# Patient Record
Sex: Female | Born: 1995 | Race: Black or African American | Hispanic: No | Marital: Single | Smoking: Never smoker
Health system: Southern US, Community
[De-identification: ages and names within clinical notes are randomized; demographics above are authoritative.]

## PROBLEM LIST (undated history)

## (undated) DIAGNOSIS — M419 Scoliosis, unspecified: Secondary | ICD-10-CM

## (undated) DIAGNOSIS — Z5189 Encounter for other specified aftercare: Secondary | ICD-10-CM

## (undated) DIAGNOSIS — D649 Anemia, unspecified: Secondary | ICD-10-CM

## (undated) HISTORY — PX: NO PAST SURGERIES: SHX2092

## (undated) HISTORY — PX: INCISION AND DRAINAGE: SHX5863

---
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
## 2017-02-24 LAB — OB RESULTS CONSOLE HGB/HCT, BLOOD: HEMOGLOBIN: 12.1

## 2017-10-09 LAB — OB RESULTS CONSOLE ANTIBODY SCREEN: Antibody Screen: NEGATIVE

## 2017-10-09 LAB — OB RESULTS CONSOLE PLATELET COUNT: Platelets: 229

## 2017-10-09 LAB — OB RESULTS CONSOLE RUBELLA ANTIBODY, IGM: RUBELLA: IMMUNE

## 2017-10-09 LAB — OB RESULTS CONSOLE HIV ANTIBODY (ROUTINE TESTING): HIV: NONREACTIVE

## 2017-10-09 LAB — OB RESULTS CONSOLE RPR: RPR: NONREACTIVE

## 2017-10-09 LAB — OB RESULTS CONSOLE GC/CHLAMYDIA
Chlamydia: NEGATIVE
GC PROBE AMP, GENITAL: NEGATIVE

## 2017-10-09 LAB — OB RESULTS CONSOLE ABO/RH: RH Type: POSITIVE

## 2017-10-09 LAB — CYTOLOGY - PAP: Pap: NEGATIVE

## 2017-10-09 LAB — OB RESULTS CONSOLE HGB/HCT, BLOOD
HEMATOCRIT: 39
HEMOGLOBIN: 13

## 2017-10-09 LAB — OB RESULTS CONSOLE HEPATITIS B SURFACE ANTIGEN: HEP B S AG: NEGATIVE

## 2017-12-10 NOTE — L&D Delivery Note (Signed)
Delivery Note  First Stage: Labor onset: 0809 with induction Analgesia /Anesthesia intrapartum:epidural AROM at 0809 - moderate port wine bloody fluid with two small cltas (3cm size passed in fluid)  Second Stage: Complete dilation at [phone] - onset of pushing at 0948 delivery of nonviable fetus at [phone] by CNM in direct OA position no nuchal cord Cord double clamped and cut by FOB Cord blood sample collected   Elevation in BP with active labor and through epidural placement reported rectal pressure and pain after epidural placement with complete dilation +3 station. BP range 139-155 / 90-97   Third Stage: Placenta delivered with maternal effort with 3 VC @ 0957 Approximately 3/4 placental bed covered with clot material c/w large abruption Placenta disposition: pathology Uterine tone firm / bleeding moderate - large amount of clots expressed then manually removed (placental bucket over 1/2 full with clots and blood - clots 2cm to 10+cm size)  Uterine tone improved after removal of clots - pitocin !VF bolus started / cytpotec 8000 per rectum given on 2nd recheck of bleeding  Est. Blood Loss (mL): 2 liters over course labor and birth  Complications: IUFD from acute large abruption  BP stabilized out of severe range after delivery without medications. BP range immediate PP: 118-131/70-89  Labs (scheduled for 11am) called to be drawn.  Mom to remain on BS for additional time for observation and management.  Baby to family to grieve.  Newborn: Birth Weight: 3 pounds - 3 ounces  Apgar Scores: 1-minute: 0                           5-minute: 0     Susa Loffleranya Bailey CNM, MSN, [facility] 03/20/2018, 10:43 AM

## 2018-02-24 LAB — GLUCOSE, 1 HOUR: GLUCOSE 1 HOUR: 109

## 2018-02-24 LAB — OB RESULTS CONSOLE HGB/HCT, BLOOD: HCT: 37

## 2018-02-24 LAB — OB RESULTS CONSOLE PLATELET COUNT: Platelets: 222

## 2018-03-04 ENCOUNTER — Encounter (HOSPITAL_COMMUNITY): Payer: Self-pay | Admitting: *Deleted

## 2018-03-04 ENCOUNTER — Inpatient Hospital Stay (HOSPITAL_COMMUNITY): Payer: Medicaid Other

## 2018-03-04 ENCOUNTER — Inpatient Hospital Stay (HOSPITAL_COMMUNITY)
Admission: EM | Admit: 2018-03-04 | Discharge: 2018-03-06 | DRG: 833 | Disposition: A | Discharge status: Home / Self Care | Payer: Medicaid Other | Attending: Certified Nurse Midwife | Admitting: Certified Nurse Midwife

## 2018-03-04 ENCOUNTER — Emergency Department (HOSPITAL_COMMUNITY): Payer: Medicaid Other

## 2018-03-04 ENCOUNTER — Other Ambulatory Visit: Payer: Self-pay

## 2018-03-04 DIAGNOSIS — O9A219 Injury, poisoning and certain other consequences of external causes complicating pregnancy, unspecified trimester: Secondary | ICD-10-CM

## 2018-03-04 DIAGNOSIS — O26893 Other specified pregnancy related conditions, third trimester: Principal | ICD-10-CM | POA: Diagnosis present

## 2018-03-04 DIAGNOSIS — R1030 Lower abdominal pain, unspecified: Secondary | ICD-10-CM | POA: Diagnosis present

## 2018-03-04 DIAGNOSIS — Z3A30 30 weeks gestation of pregnancy: Secondary | ICD-10-CM | POA: Diagnosis not present

## 2018-03-04 DIAGNOSIS — R51 Headache: Secondary | ICD-10-CM | POA: Diagnosis present

## 2018-03-04 DIAGNOSIS — O368390 Maternal care for abnormalities of the fetal heart rate or rhythm, unspecified trimester, not applicable or unspecified: Secondary | ICD-10-CM

## 2018-03-04 HISTORY — DX: Scoliosis, unspecified: M41.9

## 2018-03-04 LAB — TYPE AND SCREEN
ABO/RH(D): O POS
Antibody Screen: NEGATIVE

## 2018-03-04 LAB — CBC
HCT: 38.3 % (ref 36.0–46.0)
HEMOGLOBIN: 12.9 g/dL (ref 12.0–15.0)
MCH: 29 pg (ref 26.0–34.0)
MCHC: 33.7 g/dL (ref 30.0–36.0)
MCV: 86.1 fL (ref 78.0–100.0)
Platelets: 194 10*3/uL (ref 150–400)
RBC: 4.45 MIL/uL (ref 3.87–5.11)
RDW: 12.8 % (ref 11.5–15.5)
WBC: 6.6 10*3/uL (ref 4.0–10.5)

## 2018-03-04 LAB — URINALYSIS, ROUTINE W REFLEX MICROSCOPIC
BILIRUBIN URINE: NEGATIVE
Glucose, UA: NEGATIVE mg/dL
HGB URINE DIPSTICK: NEGATIVE
Ketones, ur: NEGATIVE mg/dL
NITRITE: NEGATIVE
PROTEIN: NEGATIVE mg/dL
Specific Gravity, Urine: 1.006 (ref 1.005–1.030)
pH: 6 (ref 5.0–8.0)

## 2018-03-04 LAB — BASIC METABOLIC PANEL
Anion gap: 10 (ref 5–15)
BUN: 7 mg/dL (ref 6–20)
CHLORIDE: 106 mmol/L (ref 101–111)
CO2: 21 mmol/L — ABNORMAL LOW (ref 22–32)
CREATININE: 0.57 mg/dL (ref 0.44–1.00)
Calcium: 8.9 mg/dL (ref 8.9–10.3)
GFR calc Af Amer: >60 mL/min (ref >60)
GFR calc non Af Amer: >60 mL/min (ref >60)
Glucose, Bld: 87 mg/dL (ref 65–99)
Potassium: 3.4 mmol/L — ABNORMAL LOW (ref 3.5–5.1)
SODIUM: 137 mmol/L (ref 135–145)

## 2018-03-04 LAB — SAVE SMEAR

## 2018-03-04 LAB — PROTIME-INR
INR: 0.93
Prothrombin Time: 12.4 seconds (ref 11.4–15.2)

## 2018-03-04 LAB — PLATELET COUNT: Platelets: 188 10*3/uL (ref 150–400)

## 2018-03-04 LAB — KLEIHAUER-BETKE STAIN
# Vials RhIg: 1
Fetal Cells %: 0 %
Quantitation Fetal Hemoglobin: 0 mL

## 2018-03-04 LAB — ABO/RH: ABO/RH(D): O POS

## 2018-03-04 LAB — APTT: aPTT: 27 seconds (ref 24–36)

## 2018-03-04 LAB — FIBRINOGEN: Fibrinogen: 409 mg/dL (ref 210–475)

## 2018-03-04 MED ORDER — BETAMETHASONE SOD PHOS & ACET 6 (3-3) MG/ML IJ SUSP
12.0000 mg | INTRAMUSCULAR | Status: AC
  Administered 2018-03-04 – 2018-03-05 (×2): 12 mg via INTRAMUSCULAR
  Filled 2018-03-04 (×2): qty 2

## 2018-03-04 MED ORDER — CALCIUM CARBONATE ANTACID 500 MG PO CHEW: 2.0000 | CHEWABLE_TABLET | ORAL | Status: DC | PRN

## 2018-03-04 MED ORDER — PRENATAL MULTIVITAMIN CH
1.0000 | ORAL_TABLET | Freq: Every day | ORAL | Status: DC
  Administered 2018-03-05: 1 via ORAL
  Filled 2018-03-04 (×2): qty 1

## 2018-03-04 MED ORDER — ACETAMINOPHEN 325 MG PO TABS
650.0000 mg | ORAL_TABLET | ORAL | Status: DC | PRN
  Administered 2018-03-04: 650 mg via ORAL
  Filled 2018-03-04: qty 2

## 2018-03-04 MED ORDER — SODIUM CHLORIDE 0.9 % IV BOLUS
1000.0000 mL | Freq: Once | INTRAVENOUS | Status: AC
  Administered 2018-03-04: 1000 mL via INTRAVENOUS

## 2018-03-04 MED ORDER — SODIUM CHLORIDE 0.9 % IV SOLN
INTRAVENOUS | Status: DC
  Administered 2018-03-04: 10:00:00 via INTRAVENOUS

## 2018-03-04 MED ORDER — DOCUSATE SODIUM 100 MG PO CAPS
100.0000 mg | ORAL_CAPSULE | Freq: Every day | ORAL | Status: DC
  Administered 2018-03-05 – 2018-03-06 (×2): 100 mg via ORAL
  Filled 2018-03-04 (×5): qty 1
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
## 2018-03-04 NOTE — ED Notes (Signed)
Pt placed on 2L of O2 via [place] per Dr. Emelda FearFerguson at bedside. Pt also placed on left side per Denny PeonErin, OB Rapid Response RN.

## 2018-03-04 NOTE — ED Notes (Signed)
[facility] at bedside. OB Rapid Response notified that transport is at bedside per their request. Per Denny PeonErin, [place] RN pt is not stable for transport at this time. Keep pt on monitor and continue to monitor at [facility] per Odessa FlemingErin, OB RN. EDP notified. [facility] notified.
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
## 2018-03-04 NOTE — ED Notes (Signed)
[facility] at bedside with Randie HeinzJessica Cox, OB RN to [facility].
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
## 2018-03-04 NOTE — Progress Notes (Addendum)
1009 T. Fredric MareBailey, CNM notified of FHR decel present upon pt's return from bathroom.  Orders to delay [facility] transport of pt due to FHR concerns that make pt unstable for transport.  [facility] to evaluate the pt in person. 1015 [facility] ED RN and EDP notified of CNM orders. EDP has contact number for Dr. Billy Coastaavon and states that he will call him with concerns over delay in transport. 1025 [facility] RN calling for update and to offer to pick up OBRR RN and portable EFM if needed. 1038 Spoke with Wiliam Ke. Bailey en route now to [facility] ED. 1050 Dr. Emelda FearFerguson of FP in house at [facility] and evaluating pt per request EDP. 1204 T. Fredric MareBailey, CNM requests OB RN for continuous EFM en route from [facility].  She reports improved FHR tracing and favorable BPP report. 1218 [facility] will send unit to pick up J. Cox, OB RN and portable EFM and transport to [facility] ED.

## 2018-03-04 NOTE — ED Notes (Signed)
Pt returning from bathroom per ED RN. FHR decel noted upon reinstating monitoring.

## 2018-03-05 ENCOUNTER — Other Ambulatory Visit (HOSPITAL_COMMUNITY): Payer: Self-pay | Admitting: Obstetrics and Gynecology

## 2018-03-05 ENCOUNTER — Inpatient Hospital Stay (HOSPITAL_COMMUNITY): Payer: Medicaid Other

## 2018-03-05 ENCOUNTER — Encounter (HOSPITAL_COMMUNITY): Payer: Medicaid Other

## 2018-03-05 LAB — FIBRINOGEN: Fibrinogen: 404 mg/dL (ref 210–475)

## 2018-03-05 LAB — KLEIHAUER-BETKE STAIN
# VIALS RHIG: 1
FETAL CELLS %: 0 %
QUANTITATION FETAL HEMOGLOBIN: 0 mL

## 2018-03-05 LAB — PLATELET COUNT: Platelets: 190 10*3/uL (ref 150–400)

## 2018-03-05 LAB — D-DIMER, QUANTITATIVE (NOT AT ARMC): D-Dimer, Quant: 0.61 ug/mL-FEU — ABNORMAL HIGH (ref 0.00–0.50)

## 2018-03-05 MED ORDER — LACTATED RINGERS IV SOLN
INTRAVENOUS | Status: DC
  Administered 2018-03-05 – 2018-03-06 (×3): via INTRAVENOUS

## 2018-03-05 MED ORDER — LACTATED RINGERS IV BOLUS
500.0000 mL | Freq: Once | INTRAVENOUS | Status: AC
Start: 2018-03-05 — End: 2018-03-05
  Administered 2018-03-05: 500 mL via INTRAVENOUS

## 2018-03-05 NOTE — Progress Notes (Signed)
CSW spoke with bedside nurse regarding the need for CSW consult.  At this time bedside and patinet could not identify a need for CSW consult.    CSW completed a chart review and did to review any information that warrants a consult.   Please re-consult or contact if a need arises or at patient's request.  Blaine HamperAngel Boyd-Gilyard, MSW, LCSW Clinical Social Work [phone]
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
## 2018-03-06 ENCOUNTER — Inpatient Hospital Stay (HOSPITAL_COMMUNITY): Payer: Medicaid Other

## 2018-03-06 NOTE — Plan of Care (Signed)
  Problem: Clinical Measurements: Goal: Diagnostic test results will improve Outcome: Progressing   Problem: Safety: Goal: Ability to remain free from injury will improve Outcome: Progressing   Problem: Coping: Goal: Ability to identify and develop effective coping behavior will improve Outcome: Progressing

## 2018-03-06 NOTE — Progress Notes (Signed)
Pt ambulated out teaching complete  

## 2018-03-06 NOTE — Progress Notes (Signed)
Patient ID: Carrie Willis, female   DOB: 09/05/1996, 22 y.o.   MRN: [id number] No complaints Good FM No pain BP 114/67 (BP Location: Right Arm)   Pulse (!) 53   Temp 98.6 F (37 C) (Oral)   Resp 18   Ht 5' 2.5" (1.588 m)   Wt 68.9 kg (152 lb)   SpO2 100% Comment: room air  BMI 27.36 kg/m   Fhr category 1 No contractions DC home

## 2018-03-07 LAB — URINE CULTURE: Culture: 80000 — AB

## 2018-03-12 ENCOUNTER — Other Ambulatory Visit (HOSPITAL_COMMUNITY): Payer: Self-pay | Admitting: Certified Nurse Midwife

## 2018-03-12 DIAGNOSIS — O288 Other abnormal findings on antenatal screening of mother: Secondary | ICD-10-CM

## 2018-03-12 DIAGNOSIS — Z3A31 31 weeks gestation of pregnancy: Secondary | ICD-10-CM

## 2018-03-13 ENCOUNTER — Ambulatory Visit (HOSPITAL_COMMUNITY)
Admission: RE | Admit: 2018-03-13 | Discharge: 2018-03-13 | Disposition: A | Discharge status: Home / Self Care | Payer: Medicaid Other | Source: Ambulatory Visit | Attending: Certified Nurse Midwife | Admitting: Certified Nurse Midwife

## 2018-03-13 DIAGNOSIS — Z3A31 31 weeks gestation of pregnancy: Secondary | ICD-10-CM | POA: Diagnosis not present

## 2018-03-13 DIAGNOSIS — O289 Unspecified abnormal findings on antenatal screening of mother: Secondary | ICD-10-CM | POA: Diagnosis not present

## 2018-03-13 DIAGNOSIS — O288 Other abnormal findings on antenatal screening of mother: Secondary | ICD-10-CM

## 2018-03-18 ENCOUNTER — Encounter: Payer: Self-pay | Admitting: *Deleted

## 2018-03-20 ENCOUNTER — Inpatient Hospital Stay (HOSPITAL_COMMUNITY)
Admission: AD | Admit: 2018-03-20 | Discharge: 2018-03-23 | DRG: 805 | Disposition: A | Discharge status: Home / Self Care | Payer: Medicaid Other | Source: Ambulatory Visit | Attending: Obstetrics and Gynecology | Admitting: Obstetrics and Gynecology

## 2018-03-20 ENCOUNTER — Inpatient Hospital Stay (HOSPITAL_COMMUNITY): Payer: Medicaid Other | Admitting: Anesthesiology

## 2018-03-20 ENCOUNTER — Encounter: Payer: Medicaid Other | Admitting: Obstetrics and Gynecology

## 2018-03-20 ENCOUNTER — Encounter (HOSPITAL_COMMUNITY): Payer: Self-pay | Admitting: *Deleted

## 2018-03-20 ENCOUNTER — Inpatient Hospital Stay (HOSPITAL_COMMUNITY): Payer: Medicaid Other

## 2018-03-20 ENCOUNTER — Other Ambulatory Visit: Payer: Self-pay

## 2018-03-20 DIAGNOSIS — M419 Scoliosis, unspecified: Secondary | ICD-10-CM | POA: Diagnosis present

## 2018-03-20 DIAGNOSIS — Z3A32 32 weeks gestation of pregnancy: Secondary | ICD-10-CM | POA: Diagnosis not present

## 2018-03-20 DIAGNOSIS — D62 Acute posthemorrhagic anemia: Secondary | ICD-10-CM | POA: Diagnosis not present

## 2018-03-20 DIAGNOSIS — O4593 Premature separation of placenta, unspecified, third trimester: Secondary | ICD-10-CM | POA: Diagnosis present

## 2018-03-20 DIAGNOSIS — O4693 Antepartum hemorrhage, unspecified, third trimester: Secondary | ICD-10-CM

## 2018-03-20 DIAGNOSIS — O164 Unspecified maternal hypertension, complicating childbirth: Secondary | ICD-10-CM | POA: Diagnosis present

## 2018-03-20 DIAGNOSIS — O364XX0 Maternal care for intrauterine death, not applicable or unspecified: Secondary | ICD-10-CM | POA: Diagnosis present

## 2018-03-20 DIAGNOSIS — F419 Anxiety disorder, unspecified: Secondary | ICD-10-CM | POA: Diagnosis present

## 2018-03-20 DIAGNOSIS — O9081 Anemia of the puerperium: Secondary | ICD-10-CM | POA: Diagnosis not present

## 2018-03-20 DIAGNOSIS — O99344 Other mental disorders complicating childbirth: Secondary | ICD-10-CM | POA: Diagnosis present

## 2018-03-20 LAB — COMPREHENSIVE METABOLIC PANEL
ALT: 22 U/L (ref 14–54)
ALT: 21 U/L (ref 14–54)
ALT: 17 U/L (ref 14–54)
AST: 29 U/L (ref 15–41)
AST: 26 U/L (ref 15–41)
AST: 26 U/L (ref 15–41)
Albumin: 2.7 g/dL — ABNORMAL LOW (ref 3.5–5.0)
Albumin: 2.4 g/dL — ABNORMAL LOW (ref 3.5–5.0)
Albumin: 1.9 g/dL — ABNORMAL LOW (ref 3.5–5.0)
Alkaline Phosphatase: 139 U/L — ABNORMAL HIGH (ref 38–126)
Alkaline Phosphatase: 121 U/L (ref 38–126)
Alkaline Phosphatase: 102 U/L (ref 38–126)
Anion gap: 9 (ref 5–15)
Anion gap: 11 (ref 5–15)
Anion gap: 5 (ref 5–15)
BUN: 17 mg/dL (ref 6–20)
BUN: 15 mg/dL (ref 6–20)
BUN: 10 mg/dL (ref 6–20)
CO2: 21 mmol/L — ABNORMAL LOW (ref 22–32)
CO2: 20 mmol/L — ABNORMAL LOW (ref 22–32)
CO2: 24 mmol/L (ref 22–32)
Calcium: 8.5 mg/dL — ABNORMAL LOW (ref 8.9–10.3)
Calcium: 8.2 mg/dL — ABNORMAL LOW (ref 8.9–10.3)
Calcium: 8.1 mg/dL — ABNORMAL LOW (ref 8.9–10.3)
Chloride: 103 mmol/L (ref 101–111)
Chloride: 103 mmol/L (ref 101–111)
Chloride: 106 mmol/L (ref 101–111)
Creatinine, Ser: 0.84 mg/dL (ref 0.44–1.00)
Creatinine, Ser: 0.83 mg/dL (ref 0.44–1.00)
Creatinine, Ser: 0.73 mg/dL (ref 0.44–1.00)
GFR calc Af Amer: >60 mL/min (ref >60)
GFR calc Af Amer: >60 mL/min (ref >60)
GFR calc Af Amer: >60 mL/min (ref >60)
GFR calc non Af Amer: >60 mL/min (ref >60)
GFR calc non Af Amer: >60 mL/min (ref >60)
GFR calc non Af Amer: >60 mL/min (ref >60)
Glucose, Bld: 92 mg/dL (ref 65–99)
Glucose, Bld: 132 mg/dL — ABNORMAL HIGH (ref 65–99)
Glucose, Bld: 115 mg/dL — ABNORMAL HIGH (ref 65–99)
Potassium: 3.8 mmol/L (ref 3.5–5.1)
Potassium: 4.4 mmol/L (ref 3.5–5.1)
Potassium: 4 mmol/L (ref 3.5–5.1)
Sodium: 133 mmol/L — ABNORMAL LOW (ref 135–145)
Sodium: 134 mmol/L — ABNORMAL LOW (ref 135–145)
Sodium: 135 mmol/L (ref 135–145)
Total Bilirubin: 0.7 mg/dL (ref 0.3–1.2)
Total Bilirubin: 0.8 mg/dL (ref 0.3–1.2)
Total Bilirubin: 0.5 mg/dL (ref 0.3–1.2)
Total Protein: 5.7 g/dL — ABNORMAL LOW (ref 6.5–8.1)
Total Protein: 5.1 g/dL — ABNORMAL LOW (ref 6.5–8.1)
Total Protein: 4.3 g/dL — ABNORMAL LOW (ref 6.5–8.1)

## 2018-03-20 LAB — DIC (DISSEMINATED INTRAVASCULAR COAGULATION) PANEL (NOT AT ARMC)
Fibrinogen: 211 mg/dL (ref 210–475)
INR: 1.08
Prothrombin Time: 13.9 seconds (ref 11.4–15.2)

## 2018-03-20 LAB — PROTEIN / CREATININE RATIO, URINE
Creatinine, Urine: 187 mg/dL
Protein Creatinine Ratio: 0.7 mg/mg{Cre} — ABNORMAL HIGH (ref 0.00–0.15)
Total Protein, Urine: 130 mg/dL

## 2018-03-20 LAB — PROTIME-INR
INR: 1.05
INR: 1.09
Prothrombin Time: 13.6 seconds (ref 11.4–15.2)
Prothrombin Time: 14 seconds (ref 11.4–15.2)

## 2018-03-20 LAB — CBC
HCT: 33 % — ABNORMAL LOW (ref 36.0–46.0)
HCT: 27.7 % — ABNORMAL LOW (ref 36.0–46.0)
HCT: 21.4 % — ABNORMAL LOW (ref 36.0–46.0)
Hemoglobin: 11.5 g/dL — ABNORMAL LOW (ref 12.0–15.0)
Hemoglobin: 9.5 g/dL — ABNORMAL LOW (ref 12.0–15.0)
Hemoglobin: 7.6 g/dL — ABNORMAL LOW (ref 12.0–15.0)
MCH: 29.8 pg (ref 26.0–34.0)
MCH: 29.6 pg (ref 26.0–34.0)
MCH: 30.2 pg (ref 26.0–34.0)
MCHC: 34.8 g/dL (ref 30.0–36.0)
MCHC: 34.3 g/dL (ref 30.0–36.0)
MCHC: 35.5 g/dL (ref 30.0–36.0)
MCV: 85.5 fL (ref 78.0–100.0)
MCV: 86.3 fL (ref 78.0–100.0)
MCV: 84.9 fL (ref 78.0–100.0)
PLATELETS: 98 10*3/uL — AB (ref 150–400)
Platelets: 78 10*3/uL — ABNORMAL LOW (ref 150–400)
Platelets: 57 10*3/uL — ABNORMAL LOW (ref 150–400)
RBC: 3.86 MIL/uL — ABNORMAL LOW (ref 3.87–5.11)
RBC: 3.21 MIL/uL — ABNORMAL LOW (ref 3.87–5.11)
RBC: 2.52 MIL/uL — ABNORMAL LOW (ref 3.87–5.11)
RDW: 13.6 % (ref 11.5–15.5)
RDW: 13.7 % (ref 11.5–15.5)
RDW: 13.8 % (ref 11.5–15.5)
WBC: 11.6 10*3/uL — ABNORMAL HIGH (ref 4.0–10.5)
WBC: 17.6 10*3/uL — ABNORMAL HIGH (ref 4.0–10.5)
WBC: 14.5 10*3/uL — ABNORMAL HIGH (ref 4.0–10.5)

## 2018-03-20 LAB — DIC (DISSEMINATED INTRAVASCULAR COAGULATION)PANEL
D-Dimer, Quant: 9.4 ug/mL-FEU — ABNORMAL HIGH (ref 0.00–0.50)
Platelets: 54 10*3/uL — ABNORMAL LOW (ref 150–400)
Smear Review: NONE SEEN
aPTT: 30 seconds (ref 24–36)

## 2018-03-20 LAB — URIC ACID: Uric Acid, Serum: 7.3 mg/dL — ABNORMAL HIGH (ref 2.3–6.6)

## 2018-03-20 LAB — D-DIMER, QUANTITATIVE
D-Dimer, Quant: >20 ug/mL-FEU — ABNORMAL HIGH (ref 0.00–0.50)
D-Dimer, Quant: >20 ug/mL-FEU — ABNORMAL HIGH (ref 0.00–0.50)

## 2018-03-20 LAB — PREPARE RBC (CROSSMATCH)

## 2018-03-20 LAB — RPR: RPR Ser Ql: NONREACTIVE

## 2018-03-20 LAB — APTT
aPTT: 31 seconds (ref 24–36)
aPTT: 27 seconds (ref 24–36)

## 2018-03-20 LAB — SAVE SMEAR

## 2018-03-20 LAB — FIBRINOGEN
FIBRINOGEN: 237 mg/dL (ref 210–475)
Fibrinogen: 212 mg/dL (ref 210–475)

## 2018-03-20 MED ORDER — LACTATED RINGERS IV SOLN: 500.0000 mL | Freq: Once | INTRAVENOUS | Status: DC

## 2018-03-20 MED ORDER — EPHEDRINE 5 MG/ML INJ: 10.0000 mg | INTRAVENOUS | Status: DC | PRN

## 2018-03-20 MED ORDER — MISOPROSTOL 200 MCG PO TABS
ORAL_TABLET | ORAL | Status: AC
  Filled 2018-03-20: qty 4

## 2018-03-20 MED ORDER — ONDANSETRON HCL 4 MG/2ML IJ SOLN: 4.0000 mg | INTRAMUSCULAR | Status: DC | PRN

## 2018-03-20 MED ORDER — OXYTOCIN 40 UNITS IN LACTATED RINGERS INFUSION - SIMPLE MED
1.0000 m[IU]/min | INTRAVENOUS | Status: DC
  Administered 2018-03-20: 2 m[IU]/min via INTRAVENOUS
  Filled 2018-03-20: qty 1000

## 2018-03-20 MED ORDER — EPHEDRINE 5 MG/ML INJ
10.0000 mg | INTRAVENOUS | Status: DC | PRN
  Filled 2018-03-20: qty 2

## 2018-03-20 MED ORDER — FENTANYL 2.5 MCG/ML BUPIVACAINE 1/10 % EPIDURAL INFUSION (WH - ANES)
14.0000 mL/h | INTRAMUSCULAR | Status: DC | PRN
  Administered 2018-03-20: 14 mL/h via EPIDURAL
  Filled 2018-03-20: qty 100

## 2018-03-20 MED ORDER — DIPHENHYDRAMINE HCL 50 MG/ML IJ SOLN: 12.5000 mg | INTRAMUSCULAR | Status: DC | PRN

## 2018-03-20 MED ORDER — FLEET ENEMA 7-19 GM/118ML RE ENEM: 1.0000 | ENEMA | RECTAL | Status: DC | PRN

## 2018-03-20 MED ORDER — ONDANSETRON HCL 4 MG PO TABS: 4.0000 mg | ORAL_TABLET | ORAL | Status: DC | PRN

## 2018-03-20 MED ORDER — LACTATED RINGERS IV BOLUS: 1000.0000 mL | Freq: Once | INTRAVENOUS | Status: DC

## 2018-03-20 MED ORDER — SODIUM CHLORIDE 0.9 % IV SOLN: Freq: Once | INTRAVENOUS | Status: DC

## 2018-03-20 MED ORDER — PHENYLEPHRINE 40 MCG/ML (10ML) SYRINGE FOR IV PUSH (FOR BLOOD PRESSURE SUPPORT)
80.0000 ug | PREFILLED_SYRINGE | INTRAVENOUS | Status: DC | PRN
  Filled 2018-03-20: qty 5

## 2018-03-20 MED ORDER — PHENYLEPHRINE 40 MCG/ML (10ML) SYRINGE FOR IV PUSH (FOR BLOOD PRESSURE SUPPORT): 80.0000 ug | PREFILLED_SYRINGE | INTRAVENOUS | Status: DC | PRN

## 2018-03-20 MED ORDER — ACETAMINOPHEN 325 MG PO TABS
650.0000 mg | ORAL_TABLET | ORAL | Status: DC | PRN
  Administered 2018-03-20 – 2018-03-21 (×3): 650 mg via ORAL
  Filled 2018-03-20 (×3): qty 2

## 2018-03-20 MED ORDER — PHENYLEPHRINE 40 MCG/ML (10ML) SYRINGE FOR IV PUSH (FOR BLOOD PRESSURE SUPPORT)
80.0000 ug | PREFILLED_SYRINGE | INTRAVENOUS | Status: DC | PRN
  Filled 2018-03-20: qty 10
  Filled 2018-03-20: qty 5

## 2018-03-20 MED ORDER — SIMETHICONE 80 MG PO CHEW: 80.0000 mg | CHEWABLE_TABLET | ORAL | Status: DC | PRN

## 2018-03-20 MED ORDER — OXYCODONE-ACETAMINOPHEN 5-325 MG PO TABS
2.0000 | ORAL_TABLET | ORAL | Status: DC | PRN
  Administered 2018-03-23: 2 via ORAL
  Filled 2018-03-20: qty 2

## 2018-03-20 MED ORDER — DIPHENHYDRAMINE HCL 50 MG/ML IJ SOLN
25.0000 mg | Freq: Once | INTRAMUSCULAR | Status: AC
  Administered 2018-03-20: 25 mg via INTRAVENOUS
  Filled 2018-03-20: qty 1

## 2018-03-20 MED ORDER — OXYCODONE-ACETAMINOPHEN 5-325 MG PO TABS: 1.0000 | ORAL_TABLET | ORAL | Status: DC | PRN

## 2018-03-20 MED ORDER — SENNOSIDES-DOCUSATE SODIUM 8.6-50 MG PO TABS
2.0000 | ORAL_TABLET | ORAL | Status: DC
  Administered 2018-03-21: 2 via ORAL
  Filled 2018-03-20 (×3): qty 2

## 2018-03-20 MED ORDER — ZOLPIDEM TARTRATE 5 MG PO TABS: 5.0000 mg | ORAL_TABLET | Freq: Every evening | ORAL | Status: DC | PRN

## 2018-03-20 MED ORDER — LACTATED RINGERS IV SOLN
INTRAVENOUS | Status: DC
  Administered 2018-03-20 – 2018-03-21 (×4): via INTRAVENOUS

## 2018-03-20 MED ORDER — LIDOCAINE HCL (PF) 1 % IJ SOLN
INTRAMUSCULAR | Status: DC | PRN
  Administered 2018-03-20: 6 mL via EPIDURAL

## 2018-03-20 MED ORDER — OXYTOCIN BOLUS FROM INFUSION
500.0000 mL | Freq: Once | INTRAVENOUS | Status: AC
  Administered 2018-03-20: 500 mL via INTRAVENOUS

## 2018-03-20 MED ORDER — MISOPROSTOL 200 MCG PO TABS
800.0000 ug | ORAL_TABLET | Freq: Once | ORAL | Status: AC
Start: 2018-03-20 — End: 2018-03-20
  Administered 2018-03-20: 800 ug via RECTAL

## 2018-03-20 MED ORDER — LIDOCAINE HCL (PF) 1 % IJ SOLN
30.0000 mL | INTRAMUSCULAR | Status: DC | PRN
  Filled 2018-03-20: qty 30

## 2018-03-20 MED ORDER — NALBUPHINE HCL 10 MG/ML IJ SOLN
10.0000 mg | INTRAMUSCULAR | Status: DC | PRN
  Administered 2018-03-20: 10 mg via INTRAVENOUS
  Filled 2018-03-20: qty 1

## 2018-03-20 MED ORDER — PRENATAL MULTIVITAMIN CH
1.0000 | ORAL_TABLET | Freq: Every day | ORAL | Status: DC
  Administered 2018-03-21: 1 via ORAL
  Filled 2018-03-20: qty 1

## 2018-03-20 MED ORDER — LACTATED RINGERS IV SOLN: 500.0000 mL | INTRAVENOUS | Status: DC | PRN

## 2018-03-20 MED ORDER — IBUPROFEN 600 MG PO TABS: 600.0000 mg | ORAL_TABLET | Freq: Four times a day (QID) | ORAL | Status: DC

## 2018-03-20 MED ORDER — ACETAMINOPHEN 325 MG PO TABS
650.0000 mg | ORAL_TABLET | Freq: Once | ORAL | Status: AC
  Administered 2018-03-20: 650 mg via ORAL
  Filled 2018-03-20: qty 2

## 2018-03-20 MED ORDER — ONDANSETRON HCL 4 MG/2ML IJ SOLN: 4.0000 mg | Freq: Four times a day (QID) | INTRAMUSCULAR | Status: DC | PRN

## 2018-03-20 MED ORDER — SOD CITRATE-CITRIC ACID 500-334 MG/5ML PO SOLN: 30.0000 mL | ORAL | Status: DC | PRN

## 2018-03-20 MED ORDER — BENZOCAINE-MENTHOL 20-0.5 % EX AERO: 1.0000 "application " | INHALATION_SPRAY | CUTANEOUS | Status: DC | PRN

## 2018-03-20 MED ORDER — OXYTOCIN 40 UNITS IN LACTATED RINGERS INFUSION - SIMPLE MED: 2.5000 [IU]/h | INTRAVENOUS | Status: DC

## 2018-03-20 MED ORDER — PROMETHAZINE HCL 25 MG/ML IJ SOLN: 12.5000 mg | Freq: Four times a day (QID) | INTRAMUSCULAR | Status: DC | PRN

## 2018-03-20 NOTE — Progress Notes (Signed)
I have been working with Ian MalkinZach and [name] and their family since I was paged to her birth this morning.  Provided emotional and spiritual support for the couple and Carrie Willis and brother (and his partner) and her sister.  This afternoon, we began processing her feelings around the loss of her son and the complicated grief after the loss of her daughter at 7016 weeks a couple of years ago.  She was appropriately tearful and has good support.  Spent significant time with the family throughout the day and offered grief education and support materials.  Pt is aware of how to reach spiritual care for follow up.    Please page as further needs arise.  Maryanna ShapeAmanda M. Carley Hammedavee Lomax, M.Div. [facility] Chaplain Pager [phone] Office [phone]

## 2018-03-20 NOTE — MAU Note (Signed)
Pt presents to MAU c/o vaginal bleeding that started around 0200 and passing multiple large clots. Pt also reports abdominal and back cramping and pressure rating it 8/10. Pt reports +FM in triage.

## 2018-03-20 NOTE — Plan of Care (Signed)
Dr. Richardson LandryHouser notified of pt platelets at 5478. REq epidural be capped and left in place. Also REq notification of 1700 lab results

## 2018-03-20 NOTE — Progress Notes (Signed)
S:  IV meds not effective for pain control - intense cramps in lower abdomen and back       k-pad helping some on abdomen  O:  VS: BP 121/82   Pulse 98   Temp 98.4 F (36.9 C) (Oral)   Resp 18   SpO2 100%        Toco: contractions not graphing well - ?every 1-3 minutes        cervix : 3cm / 80% vtx 0 station        membranes: AROM - moderate blood and clot passed       Up to bathroom to void  A: latent labor     thrombocytopenia at 1398- abruption with risk DIC / ? HELLP developing  P: anesthesia called for epidural consult/ placement     repeat labs 11am   Carrie Willis CNM, MSN, [facility] 03/20/2018, 8:23 AM
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
## 2018-03-20 NOTE — MAU Note (Signed)
Upon applying FHR monitors and maternal pulse ox maternal HR was measuring at 130-140 unable to find FHT. Provider came to bedside, bedside US performed. Offical US performed by ultrasonographer and confirmed no FHT. IV and labs obtained.   Pt boyfriend at bedside.   Called report to L&D charge nurse pt to room 175.

## 2018-03-20 NOTE — Progress Notes (Signed)
  Discussion with family throughout the morning since admission to BS: Informed no known etiology at this time other than acute abruption that disrupted blood supply to baby. The cause of abruption is not known at this time but that placenta and additional lab work and possibly autopsy of baby may help determine exact cause.  Discussed plan with Carrie Willis and partner at bedside - reviewed plan of care for induction of labor with vaginal delivery as safest mode of delivery for her at this time. Reviewed use of pitocin / AROM / pain management options reviewed. Desires to try IV meds - prefers not to have epidural unless she really needs one.   Chaplain requested to come per family request. Grief support by CNM and nursing staff as needed. Reviewed plan for placental pathology, option of autopsy, and need to discuss arrangements for baby after birth. Additionally recommend genetic panel and thrombophilia work-up post-natal.   Baseline PIH labs this am due to BP elevations and epigastric pain. Risk for DIC with acute abruption as well.  Labs reviewed @ 0745am:    platelet 98 (previous 194)    hgb / hct: 11.5/ 33.0  (previously 12.0/38.3)    SGOT / SGPT: 29/22 (previously 13/19)    uric acid: 7.3 (previously 5.2)    creat: 0.8  (previously 0.5)   Consult with Dr Billy Coastaavon @ [id number]- reviewed lab results & plan of care:           continue with active labor management plan           repeat coags and CMP in 4 hours - monitor closely for DIC and HELLP           If epidural desired - place now as may not be option if platelets continue to fall'    Carrie Willis CNM [facility]
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
## 2018-03-20 NOTE — Anesthesia Preprocedure Evaluation (Addendum)
Anesthesia Evaluation  Patient identified by MRN, date of birth, ID band Patient awake    Reviewed: Allergy & Precautions, NPO status , Patient's Chart, lab work & pertinent test results  Airway Mallampati: II  TM Distance: >3 FB Neck ROM: Full    Dental no notable dental hx.    Pulmonary neg pulmonary ROS,    Pulmonary exam normal breath sounds clear to auscultation       Cardiovascular negative cardio ROS Normal cardiovascular exam Rhythm:Regular Rate:Normal     Neuro/Psych negative neurological ROS  negative psych ROS   GI/Hepatic negative GI ROS, Neg liver ROS,   Endo/Other  negative endocrine ROS  Renal/GU negative Renal ROS     Musculoskeletal   Abdominal   Peds  Hematology   Anesthesia Other Findings   Reproductive/Obstetrics                             Anesthesia Physical Anesthesia Plan  ASA: II  Anesthesia Plan: Epidural   Post-op Pain Management:    Induction:   PONV Risk Score and Plan:   Airway Management Planned:   Additional Equipment:   Intra-op Plan:   Post-operative Plan:   Informed Consent: I have reviewed the patients History and Physical, chart, labs and discussed the procedure including the risks, benefits and alternatives for the proposed anesthesia with the patient or authorized representative who has indicated his/her understanding and acceptance.     Plan Discussed with:   Anesthesia Plan Comments:         Anesthesia Quick Evaluation

## 2018-03-20 NOTE — Anesthesia Procedure Notes (Signed)
Epidural Patient location during procedure: OB Start time: 03/20/2018 8:58 AM End time: 03/20/2018 9:16 AM  Staffing Anesthesiologist: Trevor IhaHouser, Alixandrea Milleson A, MD Performed: anesthesiologist   Preanesthetic Checklist Completed: patient identified, site marked, surgical consent, pre-op evaluation, timeout performed, IV checked, risks and benefits discussed and monitors and equipment checked  Epidural Patient position: sitting Prep: site prepped and draped and DuraPrep Patient monitoring: continuous pulse ox and blood pressure Approach: midline Location: L3-L4 Injection technique: LOR air  Needle:  Needle type: Tuohy  Needle gauge: 17 G Needle length: 9 cm and 9 Needle insertion depth: 5 cm cm Catheter type: closed end flexible Catheter size: 19 Gauge Catheter at skin depth: 10 cm Test dose: negative  Assessment Events: blood not aspirated, injection not painful, no injection resistance, negative IV test and no paresthesia

## 2018-03-20 NOTE — Anesthesia Postprocedure Evaluation (Signed)
Anesthesia Post Note  Patient: Carrie Willis  Procedure(s) Performed: AN AD HOC LABOR EPIDURAL     Patient location during evaluation: L&D Anesthesia Type: Epidural Level of consciousness: awake, awake and alert and oriented Pain management: pain level controlled Respiratory status: spontaneous breathing, nonlabored ventilation and respiratory function stable Cardiovascular status: stable Postop Assessment: no headache, no backache, adequate PO intake, no apparent nausea or vomiting and patient able to bend at knees Anesthetic complications: no    Last Vitals:  Vitals:   03/20/18 1510 03/20/18 1623  BP: (!) 141/93 139/76  Pulse: (!) 102 (!) 101  Resp: 18 18  Temp: (!) 38.4 C (!) 38 C  SpO2: 100% 100%    Last Pain:  Vitals:   03/20/18 1623  TempSrc: Oral  PainSc:    Pain Goal: Patients Stated Pain Goal: 3 (03/20/18 1420)               Keisha Amer

## 2018-03-20 NOTE — Progress Notes (Signed)
Arlan Organaniela Paul CNM made aware of pt temp of 101.1. Pt was given tylenol and temp was 100.4 after 1 hr. No new orders.

## 2018-03-20 NOTE — Progress Notes (Signed)
S/P IUFD VB at 32.2 wks / severe thrombocytopenia, PPH  S: Resting in bed, appropriately grieving, mother and [facility] at [facility] and supportive. No PEC s/s. Has not been up to ambulate, foley cath in place. No heavy bleeding or further clots.  Baby in room in open crib.   O: Vitals:   03/20/18 1415 03/20/18 1425 03/20/18 1510 03/20/18 1623  BP: 133/86  (!) 141/93 139/76  Pulse: 95  (!) 102 (!) 101  Resp: 16  18 18   Temp: (!) 100.6 F (38.1 C)  (!) 101.1 F (38.4 C) (!) 100.4 F (38 C)  TempSrc: Oral  Oral Oral  SpO2: 100%  100% 100%  Weight:  74.8 kg (165 lb)    Height:  5' 2.52" (1.588 m)     CMP Latest Ref Rng & Units 03/20/2018 03/20/2018 03/20/2018  Glucose 65 - 99 mg/dL 409(W115(H) 119(J132(H) 92  BUN 6 - 20 mg/dL 10 15 17   Creatinine 0.44 - 1.00 mg/dL 4.780.73 2.950.83 6.210.84  Sodium 135 - 145 mmol/L 135 134(L) 133(L)  Potassium 3.5 - 5.1 mmol/L 4.0 4.4 3.8  Chloride 101 - 111 mmol/L 106 103 103  CO2 22 - 32 mmol/L 24 20(L) 21(L)  Calcium 8.9 - 10.3 mg/dL 8.1(L) 8.2(L) 8.5(L)  Total Protein 6.5 - 8.1 g/dL 4.3(L) 5.1(L) 5.7(L)  Total Bilirubin 0.3 - 1.2 mg/dL 0.5 0.8 0.7  Alkaline Phos 38 - 126 U/L 102 121 139(H)  AST 15 - 41 U/L 26 26 29   ALT 14 - 54 U/L 17 21 22      CBC Latest Ref Rng & Units 03/20/2018 03/20/2018 03/20/2018  WBC 4.0 - 10.5 K/uL - 14.5(H) 17.6(H)  Hemoglobin 12.0 - 15.0 g/dL - 7.6(L) 9.5(L)  Hematocrit 36.0 - 46.0 % - 21.4(L) 27.7(L)  Platelets 150 - 400 K/uL 54(L) 57(L) 78(L)    Intake/Output Summary (Last 24 hours) at 03/20/2018 1833 Last data filed at 03/20/2018 1700 Gross per 24 hour  Intake 2175.47 ml  Output 2721 ml  Net -545.53 ml    Exam:  Gen: AAO x 3, appropriately sad Abd: NTTP, soft. Uterus: Firm, U-3 Perineum: scant lochia Ext: trace edema  Foley cath to gravity, clear yellow urine.    A/P: G2P0110, s/p IUFD/acute abruption at 32 wks, 8 hrs PP, PPH 2L Suspect acute DIC vs HELLP/PEC with labile BP Thrombocytopenia pronounced, continued drop in  platelets with last draw at 1700, asymptomatic -epidural catheter remains in place - monitor closely for s/sx of hemorrhage - repeat plts at midnight (after blood transfusion completed)  Anemia - nadir hgb 7.6 - recc PRBC 2 units transfusion now. R/B of transfusion reviewed with patient and consents.  - premedicate with APAP and Benadryl - CBC post transfusion  Labile BP - will continue to monitor closely for developing PEC - low threshold for Mag Sulfate prophylaxis with any new onset neural sx or persistent hypertension.   Perinatal loss - grieving appropriately - continued support from staff / chaplain   POC in consult w/ Dr. Alvin Critchleyaavon  Carrie C Paul, MSN, CNM 03/20/2018, 6:43 PM

## 2018-03-21 LAB — COMPREHENSIVE METABOLIC PANEL
ALBUMIN: 2 g/dL — AB (ref 3.5–5.0)
ALK PHOS: 94 U/L (ref 38–126)
ALT: 16 U/L (ref 14–54)
ALT: 16 U/L (ref 14–54)
ALT: 17 U/L (ref 14–54)
ANION GAP: 7 (ref 5–15)
AST: 22 U/L (ref 15–41)
AST: 23 U/L (ref 15–41)
AST: 23 U/L (ref 15–41)
Albumin: 2.1 g/dL — ABNORMAL LOW (ref 3.5–5.0)
Albumin: 2.2 g/dL — ABNORMAL LOW (ref 3.5–5.0)
Alkaline Phosphatase: 96 U/L (ref 38–126)
Alkaline Phosphatase: 99 U/L (ref 38–126)
Anion gap: 6 (ref 5–15)
Anion gap: 6 (ref 5–15)
BILIRUBIN TOTAL: 0.8 mg/dL (ref 0.3–1.2)
BUN: 7 mg/dL (ref 6–20)
BUN: 8 mg/dL (ref 6–20)
BUN: 8 mg/dL (ref 6–20)
CALCIUM: 8.1 mg/dL — AB (ref 8.9–10.3)
CHLORIDE: 107 mmol/L (ref 101–111)
CO2: 23 mmol/L (ref 22–32)
CO2: 24 mmol/L (ref 22–32)
CO2: 26 mmol/L (ref 22–32)
Calcium: 8.2 mg/dL — ABNORMAL LOW (ref 8.9–10.3)
Calcium: 8.4 mg/dL — ABNORMAL LOW (ref 8.9–10.3)
Chloride: 106 mmol/L (ref 101–111)
Chloride: 106 mmol/L (ref 101–111)
Creatinine, Ser: 0.63 mg/dL (ref 0.44–1.00)
Creatinine, Ser: 0.67 mg/dL (ref 0.44–1.00)
Creatinine, Ser: 0.76 mg/dL (ref 0.44–1.00)
GFR calc Af Amer: >60 mL/min (ref >60)
GFR calc Af Amer: >60 mL/min (ref >60)
GFR calc Af Amer: >60 mL/min (ref >60)
GFR calc non Af Amer: >60 mL/min (ref >60)
GFR calc non Af Amer: >60 mL/min (ref >60)
GLUCOSE: 119 mg/dL — AB (ref 65–99)
Glucose, Bld: 95 mg/dL (ref 65–99)
Glucose, Bld: 105 mg/dL — ABNORMAL HIGH (ref 65–99)
POTASSIUM: 4.3 mmol/L (ref 3.5–5.1)
POTASSIUM: 4.3 mmol/L (ref 3.5–5.1)
Potassium: 4.2 mmol/L (ref 3.5–5.1)
SODIUM: 137 mmol/L (ref 135–145)
Sodium: 136 mmol/L (ref 135–145)
Sodium: 138 mmol/L (ref 135–145)
TOTAL PROTEIN: 4.5 g/dL — AB (ref 6.5–8.1)
Total Bilirubin: 0.4 mg/dL (ref 0.3–1.2)
Total Bilirubin: 0.4 mg/dL (ref 0.3–1.2)
Total Protein: 4.4 g/dL — ABNORMAL LOW (ref 6.5–8.1)
Total Protein: 4.8 g/dL — ABNORMAL LOW (ref 6.5–8.1)

## 2018-03-21 LAB — CBC
HCT: 28.4 % — ABNORMAL LOW (ref 36.0–46.0)
HEMATOCRIT: 27.8 % — AB (ref 36.0–46.0)
HEMOGLOBIN: 9.8 g/dL — AB (ref 12.0–15.0)
Hemoglobin: 10 g/dL — ABNORMAL LOW (ref 12.0–15.0)
MCH: 29.3 pg (ref 26.0–34.0)
MCH: 29.4 pg (ref 26.0–34.0)
MCHC: 35.3 g/dL (ref 30.0–36.0)
MCHC: 35.2 g/dL (ref 30.0–36.0)
MCV: 83.2 fL (ref 78.0–100.0)
MCV: 83.5 fL (ref 78.0–100.0)
Platelets: 48 10*3/uL — ABNORMAL LOW (ref 150–400)
Platelets: 75 10*3/uL — ABNORMAL LOW (ref 150–400)
RBC: 3.34 MIL/uL — ABNORMAL LOW (ref 3.87–5.11)
RBC: 3.4 MIL/uL — ABNORMAL LOW (ref 3.87–5.11)
RDW: 15.8 % — AB (ref 11.5–15.5)
RDW: 16.3 % — ABNORMAL HIGH (ref 11.5–15.5)
WBC: 12.9 10*3/uL — ABNORMAL HIGH (ref 4.0–10.5)
WBC: 13.7 10*3/uL — ABNORMAL HIGH (ref 4.0–10.5)

## 2018-03-21 LAB — PLATELET COUNT: PLATELETS: 62 10*3/uL — AB (ref 150–400)

## 2018-03-21 MED ORDER — HYDROCORTISONE 1 % EX CREA
TOPICAL_CREAM | Freq: Three times a day (TID) | CUTANEOUS | Status: DC | PRN
Start: 2018-03-21 — End: 2018-03-23
  Administered 2018-03-21 – 2018-03-23 (×3): via TOPICAL
  Filled 2018-03-21: qty 28

## 2018-03-21 NOTE — Progress Notes (Signed)
I offered grief support to [facility] and her mother over 2 different visits today.  She is still very much in shock and is processing as best she can.  We talked specifically about some questions regarding arrangements for her baby, Carrie Willis.  She would like us to make a certificate of life and mail to her next week and she has given verbal consent for us to follow up by phone as well.  Chaplain Dyanne CarrelKAty Sweet Jarvis, Bcc PAger, [phone] 3:27 PM

## 2018-03-21 NOTE — Progress Notes (Addendum)
Notified Arlan Organaniela Paul, CNM of recent labs. Order to repeat platelets in 6 hours.

## 2018-03-21 NOTE — Lactation Note (Signed)
Lactation Consultation Note  Patient Name: Carrie FrancoMakayla V Hufstedler ZOXWR'UToday's Date: 03/21/2018   Visited with mom after fetal demise. Discussed with mom that her milk will most likely come in.mom reports she has had some milk leakage.  Enc mom to use Cabbage leaves in the bra, wear supportive bra, and ice as much as she would like. Cabbage leaves requested from the cafeteria. Handout given on drying up milk. Mom to call with any questions/concerns as needed. [facility] but has been in the past.      Maternal Data    Feeding    LATCH Score                   Interventions    Lactation Tools Discussed/Used     Consult Status      Ed BlalockSharon S Leafy Motsinger 03/21/2018, 11:37 AM
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
## 2018-03-22 LAB — TYPE AND SCREEN
ABO/RH(D): O POS
ANTIBODY SCREEN: NEGATIVE
UNIT DIVISION: 0
UNIT DIVISION: 0
Unit division: 0
Unit division: 0

## 2018-03-22 LAB — BPAM RBC
BLOOD PRODUCT EXPIRATION DATE: 201905022359
Blood Product Expiration Date: 201904182359
Blood Product Expiration Date: 201904192359
Blood Product Expiration Date: 201905022359
ISSUE DATE / TIME: 201904112218
ISSUE DATE / TIME: 201904120111
UNIT TYPE AND RH: 5100
UNIT TYPE AND RH: 5100
Unit Type and Rh: 9500
Unit Type and Rh: 9500

## 2018-03-22 LAB — COMPREHENSIVE METABOLIC PANEL
ALT: 17 U/L (ref 14–54)
AST: 23 U/L (ref 15–41)
Albumin: 2.2 g/dL — ABNORMAL LOW (ref 3.5–5.0)
Alkaline Phosphatase: 86 U/L (ref 38–126)
Anion gap: 8 (ref 5–15)
BUN: 7 mg/dL (ref 6–20)
CO2: 25 mmol/L (ref 22–32)
Calcium: 8.1 mg/dL — ABNORMAL LOW (ref 8.9–10.3)
Chloride: 107 mmol/L (ref 101–111)
Creatinine, Ser: 0.67 mg/dL (ref 0.44–1.00)
GFR calc Af Amer: >60 mL/min (ref >60)
GFR calc non Af Amer: >60 mL/min (ref >60)
Glucose, Bld: 83 mg/dL (ref 65–99)
Potassium: 4 mmol/L (ref 3.5–5.1)
Sodium: 140 mmol/L (ref 135–145)
Total Bilirubin: 0.5 mg/dL (ref 0.3–1.2)
Total Protein: 4.9 g/dL — ABNORMAL LOW (ref 6.5–8.1)

## 2018-03-22 LAB — CBC
HCT: 27.3 % — ABNORMAL LOW (ref 36.0–46.0)
Hemoglobin: 9.6 g/dL — ABNORMAL LOW (ref 12.0–15.0)
MCH: 29.4 pg (ref 26.0–34.0)
MCHC: 35.2 g/dL (ref 30.0–36.0)
MCV: 83.5 fL (ref 78.0–100.0)
Platelets: 76 10*3/uL — ABNORMAL LOW (ref 150–400)
RBC: 3.27 MIL/uL — ABNORMAL LOW (ref 3.87–5.11)
RDW: 16.3 % — ABNORMAL HIGH (ref 11.5–15.5)
WBC: 11.5 10*3/uL — ABNORMAL HIGH (ref 4.0–10.5)

## 2018-03-22 MED ORDER — MAGNESIUM OXIDE 400 (241.3 MG) MG PO TABS
400.0000 mg | ORAL_TABLET | Freq: Every day | ORAL | Status: DC
  Administered 2018-03-22 – 2018-03-23 (×2): 400 mg via ORAL
  Filled 2018-03-22 (×3): qty 1

## 2018-03-22 MED ORDER — HYDROXYZINE HCL 50 MG PO TABS
50.0000 mg | ORAL_TABLET | Freq: Once | ORAL | Status: DC | PRN
  Filled 2018-03-22: qty 1

## 2018-03-22 MED ORDER — POLYSACCHARIDE IRON COMPLEX 150 MG PO CAPS
150.0000 mg | ORAL_CAPSULE | Freq: Every day | ORAL | Status: DC
  Administered 2018-03-22 – 2018-03-23 (×2): 150 mg via ORAL
  Filled 2018-03-22 (×3): qty 1

## 2018-03-22 NOTE — Progress Notes (Signed)
Pt sitting in chair in room.

## 2018-03-22 NOTE — Progress Notes (Signed)
Pt. Smiling and laughing on the couch with family.

## 2018-03-22 NOTE — Progress Notes (Signed)
PPD 2 SVD - abruption at 32weeks / IUFD  S:  Reports feeling ok - some cramps and low backache / no PIH symptoms             breasts feeling heavy today - does not want to donate milk / lactation instructed on cabbage leaves and ice packs             Tolerating po/ No nausea or vomiting / not sleeping well             Bleeding is light             Pain controlled with Tylenol             Up ad lib / ambulatory / voiding QS  Baby "Castiel" in room at bedside on cooling blanket - planned cremation / private family memorial service  O:   VS: BP 110/68 (BP Location: Right Arm)   Pulse 98   Temp 98.3 F (36.8 C) (Oral)   Resp 20   Ht 5' 2.52" (1.588 m)   Wt 74.8 kg (165 lb)   SpO2 98%   BMI 29.68 kg/m          BP range 110-150 / 68-95 labile hypertension persists   LABS:              Recent Labs    03/21/18 1808 03/22/18 0557  WBC 13.7* 11.5*  HGB 10.0* 9.6*  PLT 75* 76*               Blood type: --/--/O POS (04/11 0532)  Rubella: Immune (10/31 0000)                     I&O: Intake/Output      04/12 0701 - 04/13 0700 04/13 0701 - 04/14 0700   P.O. 1420    I.V. (mL/kg)     Blood     Other     Total Intake(mL/kg) 1420 (19)    Urine (mL/kg/hr) 3650 (2)    Blood     Total Output 3650    Net -2230                    Physical Exam:             Alert and oriented X3  Abdomen: soft, non-tender, non-distended              Fundus: firm, non-tender, U-1  Perineum: intact  lochia: scant  extremities: no edema, no calf pain or tenderness    A: PPD # 2 SVB at 32 weeks IUFD s/p acute abruption   thrombocytopenia - stable / not high enough for epidural removal yet             labile mild hypertension without PEC features             ABL anemia - stable  P: routine post partum orders  start iron and magnesium             repeat labs in am        Marlinda Mikeanya Bailey CNM, MSN, [facility] 03/22/2018, 3:13 PM

## 2018-03-23 LAB — CBC
HCT: 28.4 % — ABNORMAL LOW (ref 36.0–46.0)
Hemoglobin: 9.7 g/dL — ABNORMAL LOW (ref 12.0–15.0)
MCH: 29 pg (ref 26.0–34.0)
MCHC: 34.2 g/dL (ref 30.0–36.0)
MCV: 85 fL (ref 78.0–100.0)
Platelets: 104 10*3/uL — ABNORMAL LOW (ref 150–400)
RBC: 3.34 MIL/uL — ABNORMAL LOW (ref 3.87–5.11)
RDW: 15.9 % — ABNORMAL HIGH (ref 11.5–15.5)
WBC: 10.4 10*3/uL (ref 4.0–10.5)

## 2018-03-23 MED ORDER — LABETALOL HCL 100 MG PO TABS
100.0000 mg | ORAL_TABLET | Freq: Three times a day (TID) | ORAL | Status: DC
  Administered 2018-03-23 (×2): 100 mg via ORAL
  Filled 2018-03-23 (×2): qty 1

## 2018-03-23 MED ORDER — BUSPIRONE HCL 5 MG PO TABS
5.0000 mg | ORAL_TABLET | Freq: Two times a day (BID) | ORAL | Status: DC
  Administered 2018-03-23: 5 mg via ORAL
  Filled 2018-03-23 (×3): qty 1

## 2018-03-23 MED ORDER — IBUPROFEN 800 MG PO TABS
400.0000 mg | ORAL_TABLET | Freq: Four times a day (QID) | ORAL | Status: DC | PRN
Start: 2018-03-23 — End: 2018-03-23

## 2018-03-23 MED ORDER — LABETALOL HCL 100 MG PO TABS: 100.0000 mg | ORAL_TABLET | Freq: Three times a day (TID) | ORAL | 1 refills | Status: DC

## 2018-03-23 MED ORDER — BUSPIRONE HCL 5 MG PO TABS: 5.0000 mg | ORAL_TABLET | Freq: Two times a day (BID) | ORAL | 0 refills | Status: DC

## 2018-03-23 MED ORDER — HYDROCORTISONE 1 % EX CREA: TOPICAL_CREAM | Freq: Three times a day (TID) | CUTANEOUS | 0 refills | Status: DC | PRN

## 2018-03-23 MED ORDER — BENZOCAINE-MENTHOL 20-0.5 % EX AERO: 1.0000 "application " | INHALATION_SPRAY | CUTANEOUS | Status: DC | PRN

## 2018-03-23 MED ORDER — POLYSACCHARIDE IRON COMPLEX 150 MG PO CAPS: 150.0000 mg | ORAL_CAPSULE | Freq: Every day | ORAL | Status: DC

## 2018-03-23 MED ORDER — MAGNESIUM OXIDE 400 (241.3 MG) MG PO TABS: 400.0000 mg | ORAL_TABLET | Freq: Every day | ORAL | Status: DC

## 2018-03-23 MED ORDER — ACETAMINOPHEN 325 MG PO TABS: 650.0000 mg | ORAL_TABLET | ORAL | Status: DC | PRN

## 2018-03-23 MED ORDER — IBUPROFEN 400 MG PO TABS: 400.0000 mg | ORAL_TABLET | Freq: Four times a day (QID) | ORAL | 0 refills | Status: DC | PRN

## 2018-03-23 NOTE — Progress Notes (Signed)
PPD 3 SVD at 32 weeks IUFD from abruption  S:  reports feeling tired - still not sleeping well / little dizziness with BRP this am / no PIH symptoms             she did not sleep well again - was not offered sleep medication that was discussed yesterday             tolerating po/ No nausea or vomiting             bleeding is light             pain controlled with acetaminophen             up ad lib / ambulatory / voiding QS  Baby "Castiel" in room - being held by mother             epidural catheter removed this morning  O: VS: BP (!) 130/96 (BP Location: Right Arm)   Pulse 81   Temp 98.6 F (37 C) (Oral)   Resp 18   Ht 5' 2.52" (1.588 m)   Wt 74.8 kg (165 lb)   SpO2 99%   BMI 29.68 kg/m                BP today: 130/96 - 127/80 - 149/102 - 141/96  LABS:             Recent Labs    03/22/18 0557 03/23/18 0604  WBC 11.5* 10.4  HGB 9.6* 9.7*  PLT 76* 104*               Blood type: --/--/O POS (04/11 0532)  Rubella: Immune (10/31 0000)                     I&O: net negative              Physical Exam:             Alert and oriented X3, appropriate affect but more sad and tearful today / holding baby most of morning                  BP seems to rise with emotional upset  Lungs: Clear and unlabored  Heart: regular rate and rhythm / no mumurs  Abdomen: soft, non-tender, non-distended              Fundus: firm, non-tender, U-1  Perineum: intact  Lochia: scant to light  Extremities: no edema, no calf pain or tenderness    A: PPD # 3 SVD at 32 weeks s/p acute abruption with IUFD             thrombocytopenia - improving (over 100 this am)             persistent labile hypertension certainly aggravated by emotional response and lack of sleep today                     cannot exclude ?vascular inflammatory response versus ?postpartum PEC versus other ?un-identifed etiology  grieving appropriate but more emotional as funeral home comes today to pick-up baby   ABL anemia - need to maintain oral hydration with water / regular meals / iron and magnesium  P: routine post partum orders  consulted with Dr Billy Coast - ok to start labetalol today - monitor BP with DC when stable             repeat PIH labs again if  persistent BP elevation             revisit DC plan this afternoon/ evening             start Buspar for anxiety / emotions for next several days  Carrie Willis CNM, MSN, [facility] 03/23/2018, 9:13 AM
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
## 2018-04-08 ENCOUNTER — Encounter: Payer: Medicaid Other | Admitting: Obstetrics and Gynecology

## 2018-04-23 ENCOUNTER — Other Ambulatory Visit (HOSPITAL_COMMUNITY): Payer: Self-pay | Admitting: Certified Nurse Midwife

## 2018-04-23 DIAGNOSIS — M79601 Pain in right arm: Secondary | ICD-10-CM

## 2018-04-23 DIAGNOSIS — M7989 Other specified soft tissue disorders: Principal | ICD-10-CM

## 2018-04-24 ENCOUNTER — Ambulatory Visit (HOSPITAL_COMMUNITY)
Admission: RE | Admit: 2018-04-24 | Discharge: 2018-04-24 | Disposition: A | Discharge status: Home / Self Care | Payer: Medicaid Other | Source: Ambulatory Visit | Attending: Obstetrics and Gynecology | Admitting: Obstetrics and Gynecology

## 2018-04-24 DIAGNOSIS — X58XXXA Exposure to other specified factors, initial encounter: Secondary | ICD-10-CM | POA: Insufficient documentation

## 2018-04-24 DIAGNOSIS — M79601 Pain in right arm: Secondary | ICD-10-CM | POA: Diagnosis not present

## 2018-04-24 DIAGNOSIS — S55291A Other specified injury of vein at forearm level, right arm, initial encounter: Secondary | ICD-10-CM | POA: Insufficient documentation

## 2018-04-24 DIAGNOSIS — I82712 Chronic embolism and thrombosis of superficial veins of left upper extremity: Secondary | ICD-10-CM | POA: Insufficient documentation

## 2018-04-24 DIAGNOSIS — M7989 Other specified soft tissue disorders: Secondary | ICD-10-CM

## 2018-04-24 NOTE — Progress Notes (Signed)
Left upper extremity venous duplex has been completed. Negative for DVT. 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 Results were given to [facility] CNM office  04/24/18 10:19 AM Olen Cordial RVT

## 2018-05-07 ENCOUNTER — Other Ambulatory Visit (HOSPITAL_COMMUNITY): Payer: Self-pay | Admitting: Certified Nurse Midwife

## 2018-05-07 DIAGNOSIS — M79602 Pain in left arm: Secondary | ICD-10-CM

## 2018-05-07 DIAGNOSIS — M7989 Other specified soft tissue disorders: Principal | ICD-10-CM

## 2018-05-08 ENCOUNTER — Ambulatory Visit (HOSPITAL_COMMUNITY)
Admission: RE | Admit: 2018-05-08 | Discharge: 2018-05-08 | Disposition: A | Discharge status: Home / Self Care | Payer: Medicaid Other | Source: Ambulatory Visit | Attending: Certified Nurse Midwife | Admitting: Certified Nurse Midwife

## 2018-05-08 DIAGNOSIS — S55299D Other specified injury of vein at forearm level, unspecified arm, subsequent encounter: Secondary | ICD-10-CM | POA: Insufficient documentation

## 2018-05-08 DIAGNOSIS — M79602 Pain in left arm: Secondary | ICD-10-CM | POA: Diagnosis not present

## 2018-05-08 DIAGNOSIS — X58XXXD Exposure to other specified factors, subsequent encounter: Secondary | ICD-10-CM | POA: Diagnosis not present

## 2018-05-08 DIAGNOSIS — M7989 Other specified soft tissue disorders: Secondary | ICD-10-CM | POA: Diagnosis not present

## 2018-05-08 DIAGNOSIS — I82711 Chronic embolism and thrombosis of superficial veins of right upper extremity: Secondary | ICD-10-CM | POA: Diagnosis not present

## 2018-05-08 NOTE — Progress Notes (Signed)
Preliminary results from tech - Left upper ext. Completed. A small, localized superficial vein thrombus in the basilic vein.  No change from prior study dated 04/24/18. Marilynne Halsted, BS, RDMS, RVT

## 2018-06-10 DIAGNOSIS — I82890 Acute embolism and thrombosis of other specified veins: Secondary | ICD-10-CM | POA: Insufficient documentation
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
## 2018-08-01 IMAGING — US US MFM FETAL BPP W/O NON-STRESS
1 series · 12 of 19 positions shown · non-contrast
Comparison: none

[Series 1: us mfm fetal bpp w/o non-stress · 19 acquisitions, 12 frames shown]
[im 1/19]
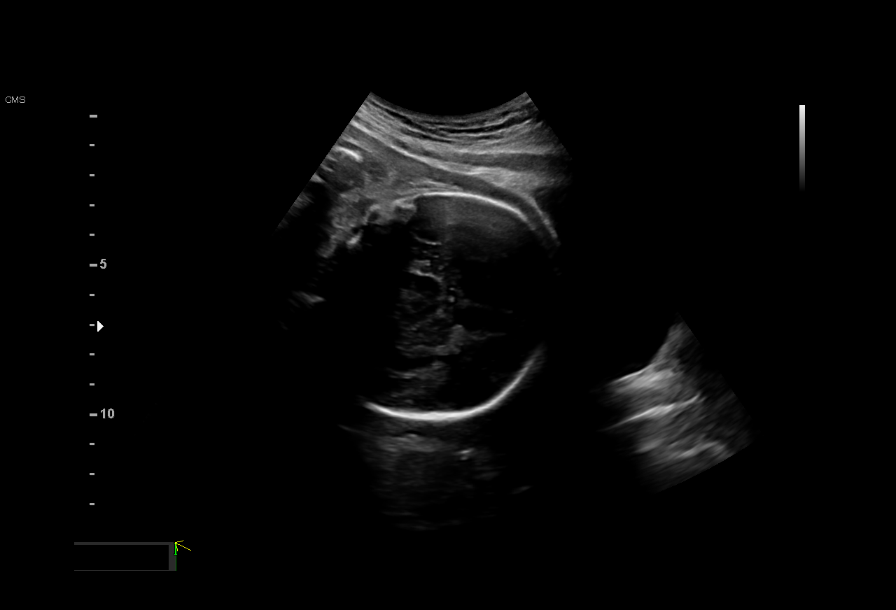
[im 3/19]
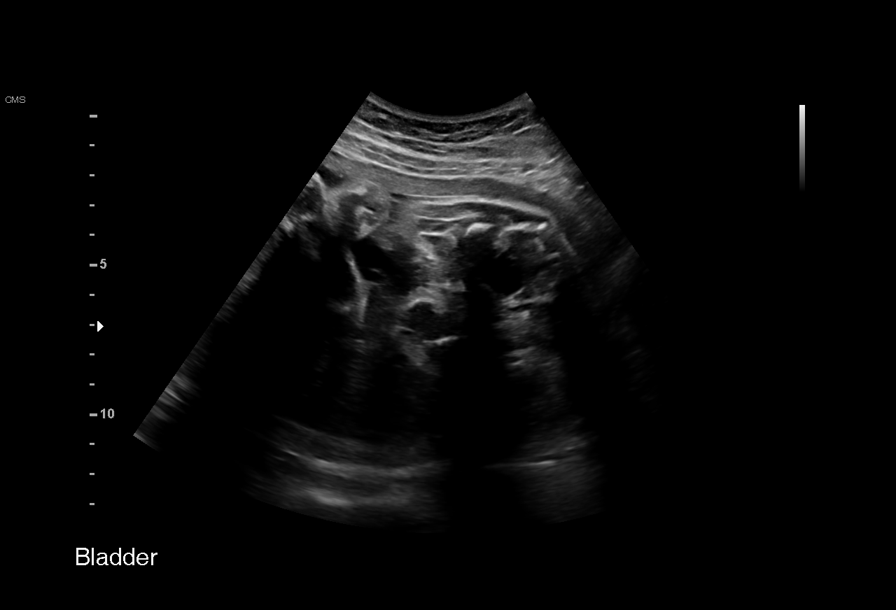
[im 4/19]
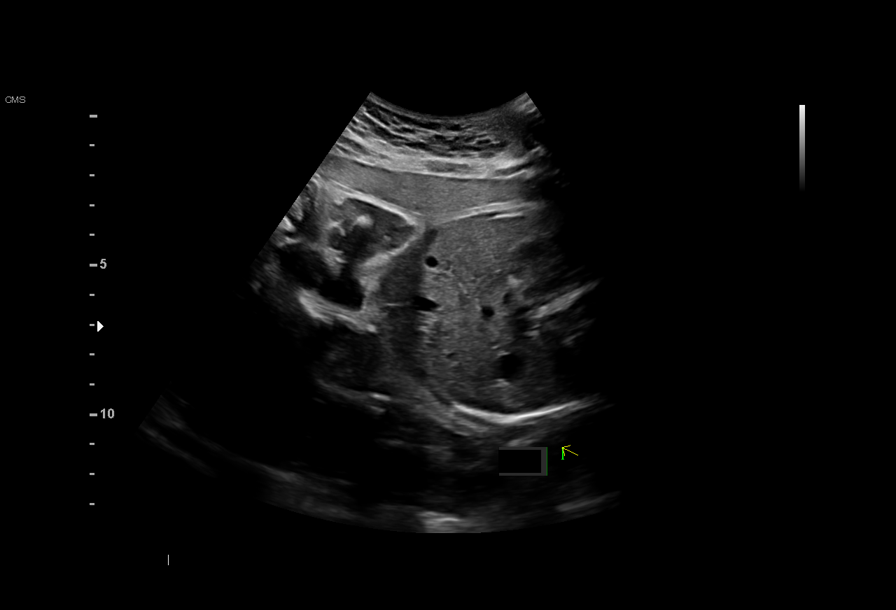
[im 6/19]
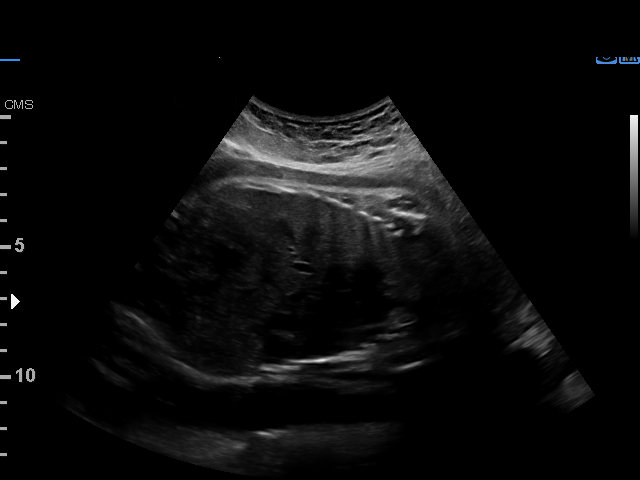
[im 8/19]
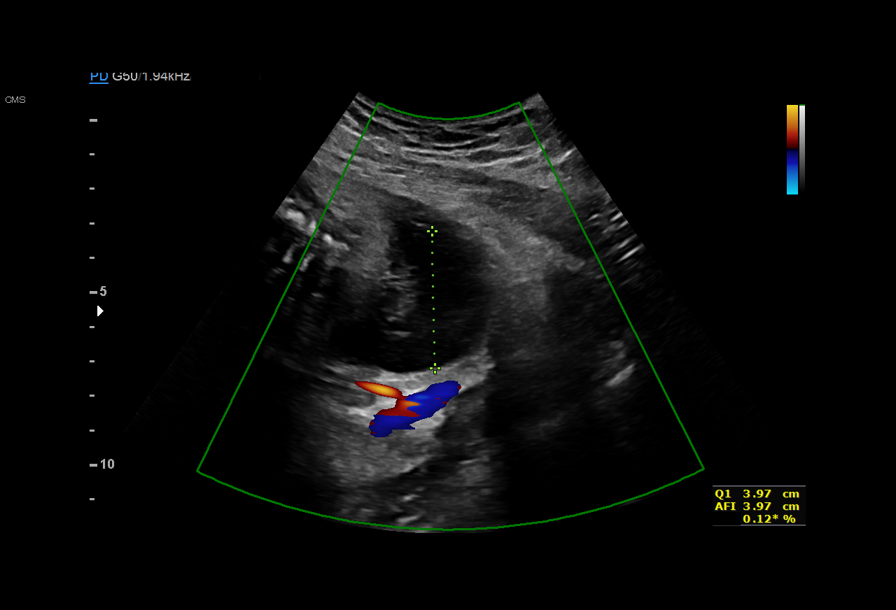
[im 9/19]
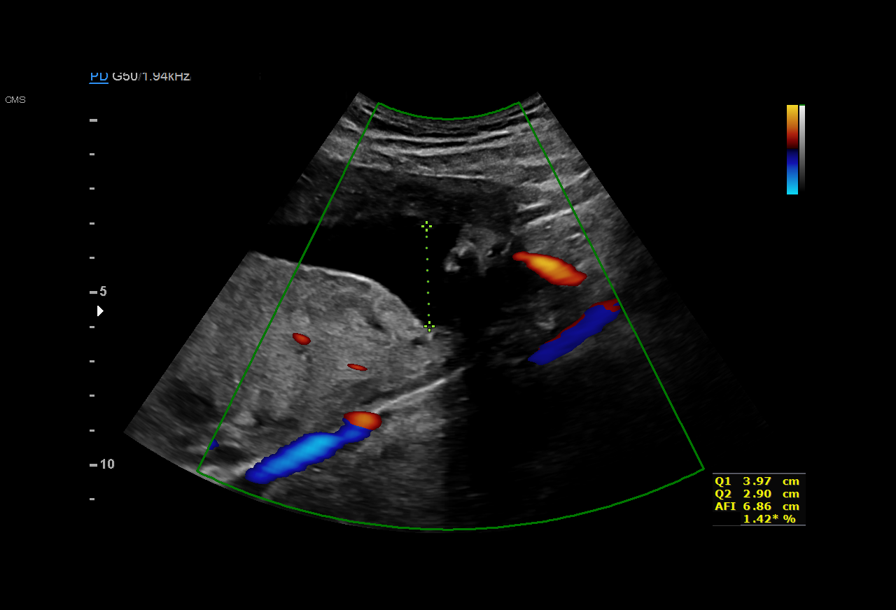
[im 11/19]
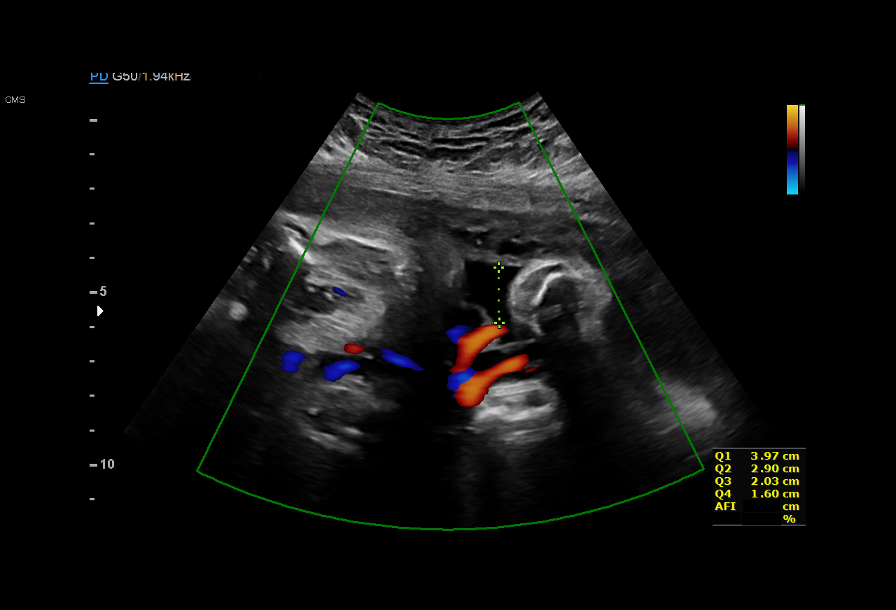
[im 12/19]
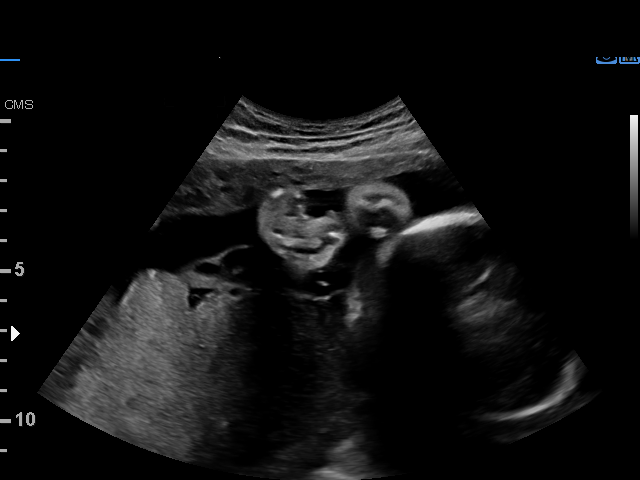
[im 14/19]
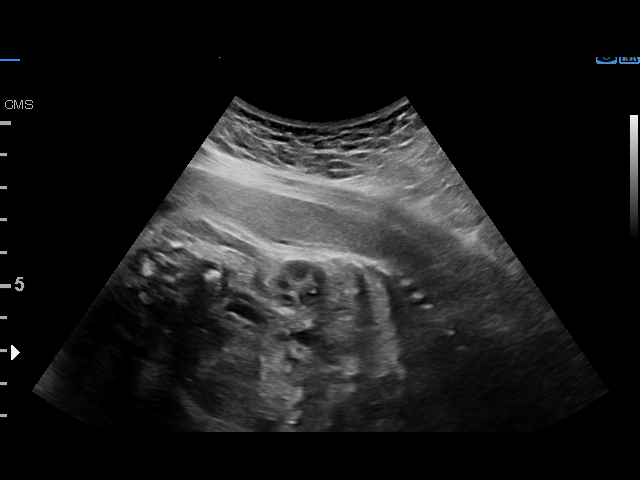
[im 16/19]
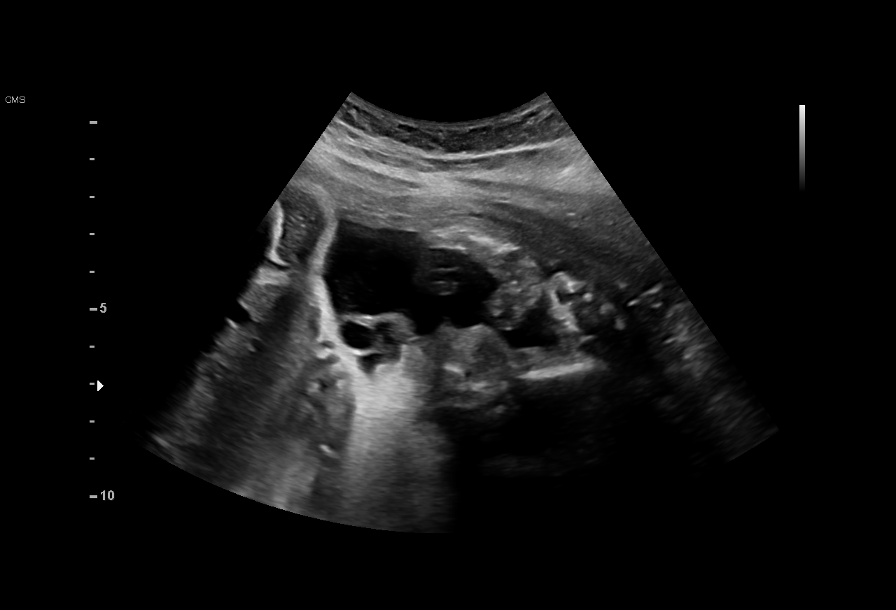
[im 17/19]
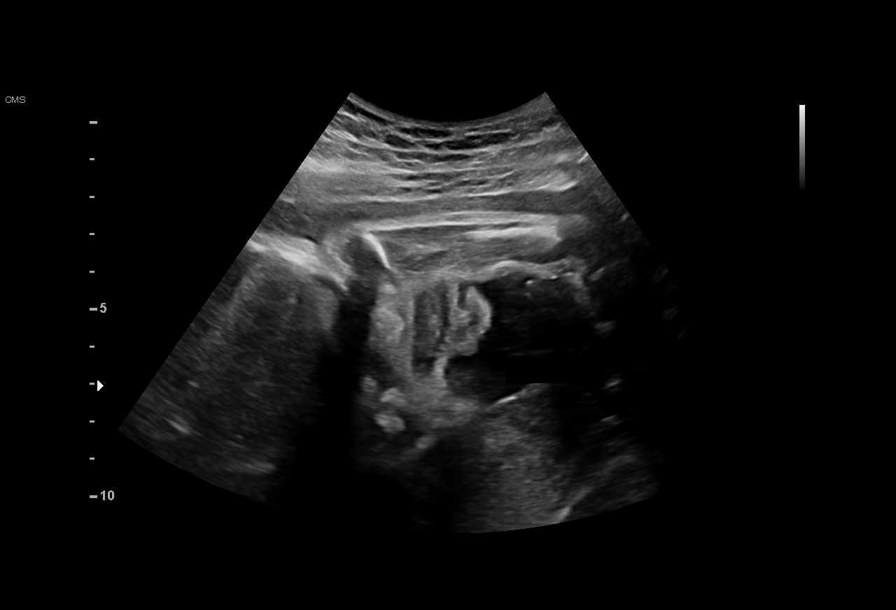
[im 19/19]
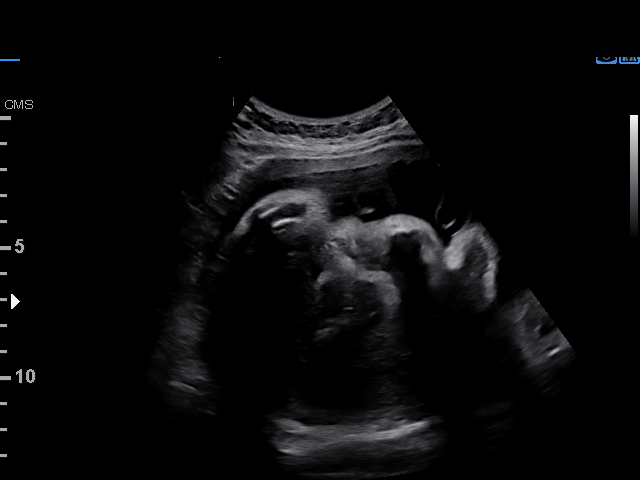

[12 of 19 positions shown; findings below may reference images not displayed]

1  BANG SAUER             [id number]      [id number]     [id number]
Indications

31 weeks gestation of pregnancy
Non-reactive NST
OB History

Gravidity:    2         Term:   0        Prem:   0        SAB:   1
TOP:          0       Ectopic:  0        Living: 0
Fetal Evaluation

Num Of Fetuses:     1
Fetal Heart         140
Rate(bpm):
Cardiac Activity:   Observed
Presentation:       Cephalic

Amniotic Fluid
AFI FV:      Subjectively within normal limits

AFI Sum(cm)     %Tile       Largest Pocket(cm)
10.5            19

RUQ(cm)       RLQ(cm)       LUQ(cm)        LLQ(cm)
3.97
Biophysical Evaluation

Amniotic F.V:   Within normal limits       F. Tone:        Observed
F. Movement:    Observed                   Score:          [DATE]
F. Breathing:   Observed
Gestational Age
Clinical EDD:  31w 2d                                        EDD:   05/13/18
Best:          31w 2d     Det. By:  Clinical EDD             EDD:   05/13/18
Impression

Single living intrauterine pregnancy at 31w 2d.
Cephalic presentation.
Normal amniotic fluid volume with an AFI >10cm.
BPP [DATE].
Recommendations

Follow-up ultrasounds as clinically indicated.

## 2018-08-26 DIAGNOSIS — Z8759 Personal history of other complications of pregnancy, childbirth and the puerperium: Secondary | ICD-10-CM

## 2018-08-28 LAB — OB RESULTS CONSOLE HEPATITIS B SURFACE ANTIGEN: Hepatitis B Surface Ag: NEGATIVE

## 2018-08-28 LAB — OB RESULTS CONSOLE HIV ANTIBODY (ROUTINE TESTING): HIV: NONREACTIVE

## 2018-08-28 LAB — OB RESULTS CONSOLE RUBELLA ANTIBODY, IGM: Rubella: IMMUNE

## 2018-11-19 LAB — OB RESULTS CONSOLE RPR: RPR: NONREACTIVE

## 2018-12-10 NOTE — L&D Delivery Note (Signed)
OB/GYN Faculty Practice Delivery Note  Carrie Willis is a 23 y.o. [id number] s/p SVD at [redacted]w[redacted]d. She was admitted for SROM, spontaneous onset of labor..   ROM: 14h 72m with clear fluid GBS Status: negative Maximum Maternal Temperature: Temp (48hrs), Avg:98.6 F (37 C), Min:97.8 F (36.6 C), Max:100 F (37.8 C)  Labor Progress: . Admitted in early labor with SROM . Started on pitocin around 0400 to help with intensity of contractions . Epidural placed . Progressed to complete  Delivery Date/Time: 02/22/19 at 1115 Delivery: Called to room and patient was complete and pushing. Head delivered LOA. No nuchal cord present. Shoulder and body delivered in usual fashion. Infant with spontaneous cry, placed on mother's abdomen, dried and stimulated. Cord clamped x 2 after 1-minute delay, and cut by father of baby. Cord blood drawn. Placenta delivered spontaneously with gentle cord traction. Fundus firm with massage and Pitocin. Labia, perineum, vagina, and cervix inspected inspected with bilateral labial lacerations.   Placenta: spontaneous, intact, 3-vessel cord Complications: PPH - QBL 1008 per Triton - received sweep of lower uterine segment with removal of clots - received methergine x 1, cytotec 800mg  rectally x 1, TXA - bleeding then improved on transfer to mother-baby unit, VSS and asymptomatic throughout, HgB from 12.3>11.9, foley catheter placed Lacerations: bilateral labial repaired with 4-0 Monocryl EBL: 10008cc Analgesia: epidural, lidocaine locally   Postpartum Planning [x]  message to sent to schedule follow-up  [x]  vaccines UTD - see Care Everywhere  Infant: Vigorous female  APGARs 8, 56  3150g  [name] S. Earlene Plater, DO OB/GYN Fellow, Faculty Practice

## 2018-12-29 ENCOUNTER — Emergency Department (HOSPITAL_COMMUNITY)
Admission: EM | Admit: 2018-12-29 | Discharge: 2018-12-29 | Disposition: A | Discharge status: Home / Self Care | Payer: Medicaid Other | Attending: Emergency Medicine | Admitting: Emergency Medicine

## 2018-12-29 ENCOUNTER — Encounter (HOSPITAL_COMMUNITY): Payer: Self-pay | Admitting: Emergency Medicine

## 2018-12-29 ENCOUNTER — Other Ambulatory Visit: Payer: Self-pay

## 2018-12-29 DIAGNOSIS — Z7982 Long term (current) use of aspirin: Secondary | ICD-10-CM | POA: Diagnosis not present

## 2018-12-29 DIAGNOSIS — Z3A30 30 weeks gestation of pregnancy: Secondary | ICD-10-CM | POA: Diagnosis not present

## 2018-12-29 DIAGNOSIS — O99713 Diseases of the skin and subcutaneous tissue complicating pregnancy, third trimester: Secondary | ICD-10-CM | POA: Insufficient documentation

## 2018-12-29 DIAGNOSIS — R202 Paresthesia of skin: Secondary | ICD-10-CM

## 2018-12-29 DIAGNOSIS — Z79899 Other long term (current) drug therapy: Secondary | ICD-10-CM | POA: Diagnosis not present

## 2018-12-29 DIAGNOSIS — R200 Anesthesia of skin: Secondary | ICD-10-CM | POA: Diagnosis present

## 2018-12-29 LAB — CBC WITH DIFFERENTIAL/PLATELET
Abs Immature Granulocytes: 0.04 10*3/uL (ref 0.00–0.07)
Basophils Absolute: 0 10*3/uL (ref 0.0–0.1)
Basophils Relative: 0 %
EOS ABS: 0.3 10*3/uL (ref 0.0–0.5)
Eosinophils Relative: 3 %
HCT: 35.8 % — ABNORMAL LOW (ref 36.0–46.0)
Hemoglobin: 11.6 g/dL — ABNORMAL LOW (ref 12.0–15.0)
IMMATURE GRANULOCYTES: 1 %
Lymphocytes Relative: 33 %
Lymphs Abs: 3 10*3/uL (ref 0.7–4.0)
MCH: 29.1 pg (ref 26.0–34.0)
MCHC: 32.4 g/dL (ref 30.0–36.0)
MCV: 89.9 fL (ref 80.0–100.0)
Monocytes Absolute: 0.4 10*3/uL (ref 0.1–1.0)
Monocytes Relative: 5 %
NEUTROS ABS: 5.1 10*3/uL (ref 1.7–7.7)
NEUTROS PCT: 58 %
PLATELETS: 195 10*3/uL (ref 150–400)
RBC: 3.98 MIL/uL (ref 3.87–5.11)
RDW: 13.2 % (ref 11.5–15.5)
WBC: 8.9 10*3/uL (ref 4.0–10.5)
nRBC: 0 % (ref 0.0–0.2)

## 2018-12-29 LAB — BASIC METABOLIC PANEL
Anion gap: 7 (ref 5–15)
BUN: 6 mg/dL (ref 6–20)
CALCIUM: 8.7 mg/dL — AB (ref 8.9–10.3)
CO2: 22 mmol/L (ref 22–32)
Chloride: 106 mmol/L (ref 98–111)
Creatinine, Ser: 0.47 mg/dL (ref 0.44–1.00)
GFR calc Af Amer: >60 mL/min (ref >60)
GLUCOSE: 117 mg/dL — AB (ref 70–99)
Potassium: 3.5 mmol/L (ref 3.5–5.1)
SODIUM: 135 mmol/L (ref 135–145)

## 2018-12-29 NOTE — ED Triage Notes (Signed)
Pt c/o left leg numbness 20-30 minutes yesterday. Pt states leg feels normal at this time. Pt is [redacted] weeks pregnant.

## 2018-12-29 NOTE — Discharge Instructions (Addendum)
Follow-up with your doctor Wednesday as planned
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
## 2019-02-11 LAB — OB RESULTS CONSOLE GBS: GBS: NEGATIVE

## 2019-02-18 ENCOUNTER — Inpatient Hospital Stay (HOSPITAL_COMMUNITY)
Admission: EM | Admit: 2019-02-18 | Discharge: 2019-02-18 | Disposition: A | Discharge status: Home / Self Care | Payer: Medicaid Other | Attending: Obstetrics and Gynecology | Admitting: Obstetrics and Gynecology

## 2019-02-18 ENCOUNTER — Encounter (HOSPITAL_COMMUNITY): Payer: Self-pay

## 2019-02-18 ENCOUNTER — Other Ambulatory Visit: Payer: Self-pay

## 2019-02-18 DIAGNOSIS — O9989 Other specified diseases and conditions complicating pregnancy, childbirth and the puerperium: Secondary | ICD-10-CM | POA: Insufficient documentation

## 2019-02-18 DIAGNOSIS — Z3A37 37 weeks gestation of pregnancy: Secondary | ICD-10-CM

## 2019-02-18 DIAGNOSIS — M419 Scoliosis, unspecified: Secondary | ICD-10-CM | POA: Insufficient documentation

## 2019-02-18 DIAGNOSIS — Z0371 Encounter for suspected problem with amniotic cavity and membrane ruled out: Secondary | ICD-10-CM | POA: Diagnosis not present

## 2019-02-18 DIAGNOSIS — Z91018 Allergy to other foods: Secondary | ICD-10-CM | POA: Diagnosis not present

## 2019-02-18 DIAGNOSIS — Z888 Allergy status to other drugs, medicaments and biological substances status: Secondary | ICD-10-CM | POA: Insufficient documentation

## 2019-02-18 LAB — URINALYSIS, ROUTINE W REFLEX MICROSCOPIC
Bilirubin Urine: NEGATIVE
Glucose, UA: NEGATIVE mg/dL
Hgb urine dipstick: NEGATIVE
KETONES UR: 20 mg/dL — AB
Nitrite: NEGATIVE
PROTEIN: NEGATIVE mg/dL
Specific Gravity, Urine: 1.019 (ref 1.005–1.030)
pH: 5 (ref 5.0–8.0)

## 2019-02-18 LAB — WET PREP, GENITAL
CLUE CELLS WET PREP: NONE SEEN
Sperm: NONE SEEN
TRICH WET PREP: NONE SEEN
YEAST WET PREP: NONE SEEN

## 2019-02-18 LAB — POCT FERN TEST: POCT Fern Test: NEGATIVE

## 2019-02-18 LAB — OB RESULTS CONSOLE GBS: GBS: NEGATIVE

## 2019-02-18 NOTE — MAU Note (Signed)
Pt states that while she was sleeping she had to go to the bathroom more than normal and she states her underwear would be wet.   Pt states today she has been experiencing pressure in her vaginal and states she has had a few ctx's today.   Denies vaginal bleeding.   Reports +FM

## 2019-02-18 NOTE — Discharge Instructions (Signed)
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
## 2019-02-21 ENCOUNTER — Inpatient Hospital Stay (HOSPITAL_COMMUNITY)
Admission: AD | Admit: 2019-02-21 | Discharge: 2019-02-24 | DRG: 806 | Disposition: A | Discharge status: Home / Self Care | Payer: Medicaid Other | Attending: Obstetrics & Gynecology | Admitting: Obstetrics & Gynecology

## 2019-02-21 DIAGNOSIS — Z8759 Personal history of other complications of pregnancy, childbirth and the puerperium: Secondary | ICD-10-CM

## 2019-02-21 DIAGNOSIS — Z3A37 37 weeks gestation of pregnancy: Secondary | ICD-10-CM

## 2019-02-21 HISTORY — DX: Anemia, unspecified: D64.9

## 2019-02-21 HISTORY — DX: Encounter for other specified aftercare: Z51.89

## 2019-02-22 ENCOUNTER — Other Ambulatory Visit: Payer: Self-pay

## 2019-02-22 ENCOUNTER — Inpatient Hospital Stay (HOSPITAL_COMMUNITY): Payer: Medicaid Other | Admitting: Anesthesiology

## 2019-02-22 ENCOUNTER — Encounter (HOSPITAL_COMMUNITY): Payer: Self-pay

## 2019-02-22 DIAGNOSIS — O26893 Other specified pregnancy related conditions, third trimester: Secondary | ICD-10-CM | POA: Diagnosis present

## 2019-02-22 DIAGNOSIS — Z3A37 37 weeks gestation of pregnancy: Secondary | ICD-10-CM | POA: Diagnosis not present

## 2019-02-22 LAB — CBC
HCT: 36 % (ref 36.0–46.0)
HCT: 35.8 % — ABNORMAL LOW (ref 36.0–46.0)
Hemoglobin: 12.3 g/dL (ref 12.0–15.0)
Hemoglobin: 11.9 g/dL — ABNORMAL LOW (ref 12.0–15.0)
MCH: 29.8 pg (ref 26.0–34.0)
MCH: 28.6 pg (ref 26.0–34.0)
MCHC: 34.2 g/dL (ref 30.0–36.0)
MCHC: 33.2 g/dL (ref 30.0–36.0)
MCV: 87.2 fL (ref 80.0–100.0)
MCV: 86.1 fL (ref 80.0–100.0)
Platelets: 185 10*3/uL (ref 150–400)
Platelets: 152 10*3/uL (ref 150–400)
RBC: 4.13 MIL/uL (ref 3.87–5.11)
RBC: 4.16 MIL/uL (ref 3.87–5.11)
RDW: 12.9 % (ref 11.5–15.5)
RDW: 12.6 % (ref 11.5–15.5)
WBC: 9.8 10*3/uL (ref 4.0–10.5)
WBC: 11.2 10*3/uL — AB (ref 4.0–10.5)
nRBC: 0 % (ref 0.0–0.2)
nRBC: 0 % (ref 0.0–0.2)

## 2019-02-22 LAB — POCT FERN TEST: POCT FERN TEST: POSITIVE

## 2019-02-22 LAB — TYPE AND SCREEN
ABO/RH(D): O POS
ANTIBODY SCREEN: NEGATIVE

## 2019-02-22 LAB — RPR: RPR: NONREACTIVE

## 2019-02-22 MED ORDER — METHYLERGONOVINE MALEATE 0.2 MG/ML IJ SOLN
INTRAMUSCULAR | Status: AC
  Filled 2019-02-22: qty 1

## 2019-02-22 MED ORDER — OXYTOCIN 40 UNITS IN NORMAL SALINE INFUSION - SIMPLE MED
2.5000 [IU]/h | INTRAVENOUS | Status: DC
  Filled 2019-02-22: qty 1000

## 2019-02-22 MED ORDER — WITCH HAZEL-GLYCERIN EX PADS: 1.0000 "application " | MEDICATED_PAD | CUTANEOUS | Status: DC | PRN

## 2019-02-22 MED ORDER — SOD CITRATE-CITRIC ACID 500-334 MG/5ML PO SOLN: 30.0000 mL | ORAL | Status: DC | PRN

## 2019-02-22 MED ORDER — SENNOSIDES-DOCUSATE SODIUM 8.6-50 MG PO TABS
2.0000 | ORAL_TABLET | ORAL | Status: DC
  Administered 2019-02-23: 2 via ORAL
  Filled 2019-02-22 (×2): qty 2

## 2019-02-22 MED ORDER — PHENYLEPHRINE 40 MCG/ML (10ML) SYRINGE FOR IV PUSH (FOR BLOOD PRESSURE SUPPORT): 80.0000 ug | PREFILLED_SYRINGE | INTRAVENOUS | Status: DC | PRN

## 2019-02-22 MED ORDER — MISOPROSTOL 200 MCG PO TABS
800.0000 ug | ORAL_TABLET | Freq: Once | ORAL | Status: AC
  Administered 2019-02-22: 800 ug via RECTAL

## 2019-02-22 MED ORDER — EPHEDRINE 5 MG/ML INJ: 10.0000 mg | INTRAVENOUS | Status: DC | PRN

## 2019-02-22 MED ORDER — FENTANYL-BUPIVACAINE-NACL 0.5-0.125-0.9 MG/250ML-% EP SOLN
12.0000 mL/h | EPIDURAL | Status: DC | PRN
  Administered 2019-02-22: 12 mL/h via EPIDURAL
  Filled 2019-02-22: qty 250

## 2019-02-22 MED ORDER — TRANEXAMIC ACID-NACL 1000-0.7 MG/100ML-% IV SOLN
1000.0000 mg | INTRAVENOUS | Status: AC
  Administered 2019-02-22: 1000 mg via INTRAVENOUS

## 2019-02-22 MED ORDER — LACTATED RINGERS IV SOLN: INTRAVENOUS | Status: DC

## 2019-02-22 MED ORDER — OXYCODONE-ACETAMINOPHEN 5-325 MG PO TABS: 1.0000 | ORAL_TABLET | ORAL | Status: DC | PRN

## 2019-02-22 MED ORDER — PHENYLEPHRINE 40 MCG/ML (10ML) SYRINGE FOR IV PUSH (FOR BLOOD PRESSURE SUPPORT)
80.0000 ug | PREFILLED_SYRINGE | INTRAVENOUS | Status: DC | PRN
  Filled 2019-02-22: qty 10

## 2019-02-22 MED ORDER — ONDANSETRON HCL 4 MG/2ML IJ SOLN: 4.0000 mg | Freq: Four times a day (QID) | INTRAMUSCULAR | Status: DC | PRN

## 2019-02-22 MED ORDER — DIPHENHYDRAMINE HCL 25 MG PO CAPS: 25.0000 mg | ORAL_CAPSULE | Freq: Four times a day (QID) | ORAL | Status: DC | PRN

## 2019-02-22 MED ORDER — LIDOCAINE HCL (PF) 1 % IJ SOLN
30.0000 mL | INTRAMUSCULAR | Status: AC | PRN
  Administered 2019-02-22: 30 mL via SUBCUTANEOUS
  Filled 2019-02-22: qty 30

## 2019-02-22 MED ORDER — SODIUM CHLORIDE (PF) 0.9 % IJ SOLN
INTRAMUSCULAR | Status: DC | PRN
  Administered 2019-02-22: 12 mL/h via EPIDURAL

## 2019-02-22 MED ORDER — LACTATED RINGERS IV SOLN: 500.0000 mL | Freq: Once | INTRAVENOUS | Status: DC

## 2019-02-22 MED ORDER — SIMETHICONE 80 MG PO CHEW: 80.0000 mg | CHEWABLE_TABLET | ORAL | Status: DC | PRN

## 2019-02-22 MED ORDER — LACTATED RINGERS IV SOLN
500.0000 mL | INTRAVENOUS | Status: DC | PRN
  Administered 2019-02-22: 500 mL via INTRAVENOUS

## 2019-02-22 MED ORDER — OXYTOCIN BOLUS FROM INFUSION
500.0000 mL | Freq: Once | INTRAVENOUS | Status: AC
  Administered 2019-02-22: 500 mL via INTRAVENOUS

## 2019-02-22 MED ORDER — DIBUCAINE 1 % RE OINT
1.0000 "application " | TOPICAL_OINTMENT | RECTAL | Status: DC | PRN
  Administered 2019-02-22: 1 via RECTAL
  Filled 2019-02-22: qty 28

## 2019-02-22 MED ORDER — DIPHENHYDRAMINE HCL 50 MG/ML IJ SOLN: 12.5000 mg | INTRAMUSCULAR | Status: DC | PRN

## 2019-02-22 MED ORDER — ACETAMINOPHEN 325 MG PO TABS: 650.0000 mg | ORAL_TABLET | ORAL | Status: DC | PRN

## 2019-02-22 MED ORDER — OXYCODONE-ACETAMINOPHEN 5-325 MG PO TABS: 2.0000 | ORAL_TABLET | ORAL | Status: DC | PRN

## 2019-02-22 MED ORDER — MEASLES, MUMPS & RUBELLA VAC IJ SOLR: 0.5000 mL | Freq: Once | INTRAMUSCULAR | Status: DC

## 2019-02-22 MED ORDER — TERBUTALINE SULFATE 1 MG/ML IJ SOLN: 0.2500 mg | Freq: Once | INTRAMUSCULAR | Status: DC | PRN

## 2019-02-22 MED ORDER — IBUPROFEN 600 MG PO TABS
600.0000 mg | ORAL_TABLET | Freq: Four times a day (QID) | ORAL | Status: DC
  Administered 2019-02-22 – 2019-02-24 (×7): 600 mg via ORAL
  Filled 2019-02-22 (×7): qty 1

## 2019-02-22 MED ORDER — TRANEXAMIC ACID-NACL 1000-0.7 MG/100ML-% IV SOLN
INTRAVENOUS | Status: AC
  Filled 2019-02-22: qty 100

## 2019-02-22 MED ORDER — METHYLERGONOVINE MALEATE 0.2 MG/ML IJ SOLN
0.2000 mg | Freq: Once | INTRAMUSCULAR | Status: AC
  Administered 2019-02-22: 0.2 mg via INTRAMUSCULAR

## 2019-02-22 MED ORDER — ONDANSETRON HCL 4 MG PO TABS: 4.0000 mg | ORAL_TABLET | ORAL | Status: DC | PRN

## 2019-02-22 MED ORDER — FENTANYL CITRATE (PF) 100 MCG/2ML IJ SOLN
100.0000 ug | INTRAMUSCULAR | Status: DC | PRN
  Administered 2019-02-22: 100 ug via INTRAVENOUS
  Filled 2019-02-22: qty 2

## 2019-02-22 MED ORDER — TRANEXAMIC ACID-NACL 1000-0.7 MG/100ML-% IV SOLN: 1000.0000 mg | Freq: Once | INTRAVENOUS | Status: DC | PRN

## 2019-02-22 MED ORDER — COCONUT OIL OIL
1.0000 "application " | TOPICAL_OIL | Status: DC | PRN
  Administered 2019-02-24: 1 via TOPICAL

## 2019-02-22 MED ORDER — LACTATED RINGERS IV BOLUS: 1000.0000 mL | Freq: Once | INTRAVENOUS | Status: DC

## 2019-02-22 MED ORDER — ONDANSETRON HCL 4 MG/2ML IJ SOLN: 4.0000 mg | INTRAMUSCULAR | Status: DC | PRN

## 2019-02-22 MED ORDER — LACTATED RINGERS IV SOLN
INTRAVENOUS | Status: DC
  Administered 2019-02-22 (×2): via INTRAVENOUS

## 2019-02-22 MED ORDER — ACETAMINOPHEN 325 MG PO TABS
650.0000 mg | ORAL_TABLET | ORAL | Status: DC | PRN
  Administered 2019-02-22: 650 mg via ORAL
  Filled 2019-02-22: qty 2

## 2019-02-22 MED ORDER — TETANUS-DIPHTH-ACELL PERTUSSIS 5-2.5-18.5 LF-MCG/0.5 IM SUSP: 0.5000 mL | Freq: Once | INTRAMUSCULAR | Status: DC

## 2019-02-22 MED ORDER — LIDOCAINE-EPINEPHRINE (PF) 2 %-1:200000 IJ SOLN
INTRAMUSCULAR | Status: DC | PRN
  Administered 2019-02-22: 2 mL via EPIDURAL
  Administered 2019-02-22: 5 mL via EPIDURAL

## 2019-02-22 MED ORDER — PRENATAL MULTIVITAMIN CH
1.0000 | ORAL_TABLET | Freq: Every day | ORAL | Status: DC
  Administered 2019-02-22 – 2019-02-23 (×2): 1 via ORAL
  Filled 2019-02-22 (×2): qty 1

## 2019-02-22 MED ORDER — OXYTOCIN 40 UNITS IN NORMAL SALINE INFUSION - SIMPLE MED
1.0000 m[IU]/min | INTRAVENOUS | Status: DC
  Administered 2019-02-22: 2 m[IU]/min via INTRAVENOUS

## 2019-02-22 MED ORDER — MISOPROSTOL 200 MCG PO TABS
ORAL_TABLET | ORAL | Status: AC
  Filled 2019-02-22: qty 4

## 2019-02-22 MED ORDER — BENZOCAINE-MENTHOL 20-0.5 % EX AERO
1.0000 "application " | INHALATION_SPRAY | CUTANEOUS | Status: DC | PRN
  Administered 2019-02-22: 1 via TOPICAL
  Filled 2019-02-22: qty 56

## 2019-02-22 NOTE — Progress Notes (Addendum)
OB/GYN Faculty Practice: Labor Progress Note  Subjective: Late entry, in room pushing with patient. Into room to meet patient, RN doing practice push with patient who is now complete and feeling pressure to push.  Objective: BP 122/74   Pulse (!) 104   Temp 97.8 F (36.6 C) (Oral)   Resp 18   Ht 5' 2.5" (1.588 m)   Wt 77.6 kg   SpO2 98% Comment: room air   BMI 30.78 kg/m  Gen: mildly uncomfortable appearing during contractions Dilation: 10 Dilation Complete Date: 02/22/19 Dilation Complete Time: 1059 Effacement (%): 100 Cervical Position: Middle Station: Plus 2 Presentation: Vertex Exam by:: A Showfety RN  Assessment and Plan: 23 y.o. [id number] [redacted]w[redacted]d here with SROM/SOL.  Labor: Second stage of labor. Will start pushing, anticipate SVD.  -- pain control: epidural in place -- PPH Risk: medium  Fetal Well-Being: EFW 7lbs by Leopolds. Cephalic by sutures.  -- Category I - continuous fetal monitoring  -- GBS negative    Andres Vest S. Earlene Plater, DO OB/GYN Fellow, Faculty Practice  11:55 AM

## 2019-02-22 NOTE — Anesthesia Pain Management Evaluation Note (Signed)
  [facility] Pain Management Visit Note  Patient: Carrie Willis, 23 y.o., female  "Hello I am a member of the anesthesia team at [facility]. We have an anesthesia team available at all times to provide care throughout the hospital, including epidural management and anesthesia for C-section. I don't know your plan for the delivery whether it a natural birth, water birth, IV sedation, nitrous supplementation, doula or epidural, but we want to meet your pain goals."   1.Was your pain managed to your expectations on prior hospitalizations?   No   2.What is your expectation for pain management during this hospitalization?     Epidural  3.How can we help you reach that goal? Support prn  Record the patient's initial score and the patient's pain goal.   Pain: 10  Pain Goal: 7 [facility] wants you to be able to say your pain was always managed very well.  [facility] 02/22/2019

## 2019-02-22 NOTE — Anesthesia Postprocedure Evaluation (Signed)
Anesthesia Post Note  Patient: Carrie Willis  Procedure(s) Performed: AN AD HOC LABOR EPIDURAL     Patient location during evaluation: Mother Baby Anesthesia Type: Epidural Level of consciousness: awake and alert Pain management: pain level controlled Vital Signs Assessment: post-procedure vital signs reviewed and stable Respiratory status: spontaneous breathing, nonlabored ventilation and respiratory function stable Cardiovascular status: stable Postop Assessment: no headache, no backache and epidural receding Anesthetic complications: no    Last Vitals:  Vitals:   02/22/19 1617 02/22/19 1806  BP: 115/72   Pulse: 96   Resp: 20   Temp: 37.8 C 37.1 C  SpO2:      Last Pain:  Vitals:   02/22/19 1806  TempSrc: Oral  PainSc:    Pain Goal:                   [facility]

## 2019-02-22 NOTE — Plan of Care (Signed)

## 2019-02-22 NOTE — Anesthesia Procedure Notes (Signed)
Epidural Patient location during procedure: OB Start time: 02/22/2019 9:41 AM End time: 02/22/2019 9:54 AM  Staffing Anesthesiologist: Lucretia Kern, MD Performed: anesthesiologist   Preanesthetic Checklist Completed: patient identified, pre-op evaluation, timeout performed, IV checked, risks and benefits discussed and monitors and equipment checked  Epidural Patient position: sitting Prep: DuraPrep Patient monitoring: heart rate, continuous pulse ox and blood pressure Approach: midline Location: L2-L3 Injection technique: LOR air  Needle:  Needle type: Tuohy  Needle gauge: 17 G Needle length: 9 cm Needle insertion depth: 6 cm Catheter type: closed end flexible Catheter size: 19 Gauge Catheter at skin depth: 11 cm Test dose: negative and 2% lidocaine with Epi 1:200 K  Assessment Events: blood not aspirated, injection not painful, no injection resistance, negative IV test and no paresthesia  Additional Notes Reason for block:procedure for pain

## 2019-02-22 NOTE — Discharge Summary (Addendum)
Obstetrics Discharge Summary [facility]N Faculty [facility]   Patient Name: Carrie Willis DOB: Jan 20, 1996 MRN: [id number]  Date of admission: 02/21/2019 Delivering MD: Tamera Stands   Date of discharge: 02/24/2019  Admitting diagnosis: 37 WKS CTX AND PRESSURE AND SLOWING LEAKING FLUID Intrauterine pregnancy: [redacted]w[redacted]d     Secondary diagnosis:   Active Problems:   Indication for care in labor and delivery, antepartum   History of placenta abruption   Postpartum hemorrhage  Discharge diagnosis: Term Pregnancy Delivered                                            Postpartum procedures: None Complications: None  Outpatient Follow-Up: [x]  4-6 week postpartum and birth control discussion  Hospital course: Carrie Willis is a 23 y.o. [redacted]w[redacted]d who was admitted for spontaneous onset of labor, SROM. Her pregnancy was complicated by history of placental abruption with IUFD. Her labor course was notable for augmentation with pitocin and epidural placement. Delivery was complicated by postpartum hemorrhage (EBL 1L), and bilateral labial lacerations repaired. Please see delivery/op note for additional details. Her postpartum course was uncomplicated. She was breastfeeding without difficulty. By day of discharge, she was passing flatus, urinating, eating and drinking without difficulty. Her pain was well-controlled, and she was discharged home with Ibuprofen 600mg . She will follow-up in clinic in 4-6 weeks.   Physical exam  Vitals:   02/23/19 0540 02/23/19 1433 02/23/19 2202 02/24/19 0520  BP: 118/66 113/63 101/67 111/67  Pulse: 86 83 78 84  Resp: 16 17 17 18   Temp: 98.2 F (36.8 C) 98 F (36.7 C) 98.6 F (37 C) 98.5 F (36.9 C)  TempSrc: Oral Oral Oral Oral  SpO2:   100%   Weight:      Height:       General: Well appearing, no distress, lying comfortably in bed Lochia: appropriate Uterine Fundus: firm Incision: N/A DVT Evaluation: No evidence of DVT seen on physical exam. No cords or calf  tenderness. No significant calf/ankle edema. Labs: Lab Results  Component Value Date   WBC 10.0 02/23/2019   HGB 9.5 (L) 02/23/2019   HCT 28.5 (L) 02/23/2019   MCV 87.2 02/23/2019   PLT 147 (L) 02/23/2019   CMP Latest Ref Rng & Units 12/29/2018  Glucose 70 - 99 mg/dL 259(D)  BUN 6 - 20 mg/dL 6  Creatinine 6.38 - 7.56 mg/dL 4.33  Sodium 295 - 188 mmol/L 135  Potassium 3.5 - 5.1 mmol/L 3.5  Chloride 98 - 111 mmol/L 106  CO2 22 - 32 mmol/L 22  Calcium 8.9 - 10.3 mg/dL 4.1(Y)  Total Protein 6.5 - 8.1 g/dL -  Total Bilirubin 0.3 - 1.2 mg/dL -  Alkaline Phos 38 - 606 U/L -  AST 15 - 41 U/L -  ALT 14 - 54 U/L -    Discharge instructions: Per After Visit Summary and "Baby and Me Booklet"  After visit meds:  Allergies as of 02/24/2019      Reactions   Pineapple Itching, Rash   Adhesive [tape] Other (See Comments)   Skin tears/irritation      Medication List    STOP taking these medications   aspirin EC 81 MG tablet     TAKE these medications   ibuprofen 600 MG tablet Commonly known as:  ADVIL,MOTRIN Take 1 tablet (600 mg total) by mouth every 6 (six) hours.  Prenatal Vitamins 0.8 MG tablet Take 1 tablet by mouth daily.   senna-docusate 8.6-50 MG tablet Commonly known as:  Senokot-S Take 2 tablets by mouth daily. Start taking on:  February 25, 2019   Tdap 5-2.5-18.5 LF-MCG/0.5 injection Commonly known as:  BOOSTRIX Inject 0.5 mLs into the muscle once for 1 dose.       Postpartum contraception: Undecided Diet: Routine Diet Activity: Advance as tolerated. Pelvic rest for 6 weeks.   Follow-up Appt: Patient to schedule follow-up appointment with prenatal provider in [place], [place].  Newborn Data: Live born female  Birth Weight: 6 lb 15.1 oz (3150 g) APGAR: 8, 9  Newborn Delivery   Time head delivered:  02/22/2019 11:15:00 Birth date/time:  02/22/2019 11:15:00 Delivery type:  Vaginal, Spontaneous     Baby Feeding: Breast Disposition:home with  mother  Orpah Cobb, DO [facility], PGY1  I was present for the exam and agree with above.  Katrinka Blazing, [place], [place] 02/24/2019 10:57 AM
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
## 2019-02-22 NOTE — Anesthesia Preprocedure Evaluation (Signed)
Anesthesia Evaluation  Patient identified by MRN, date of birth, ID band Patient awake    Reviewed: Allergy & Precautions, H&P , NPO status , Patient's Chart, lab work & pertinent test results  History of Anesthesia Complications Negative for: history of anesthetic complications  Airway Mallampati: II  TM Distance: >3 FB Neck ROM: full    Dental no notable dental hx.    Pulmonary neg pulmonary ROS,    Pulmonary exam normal        Cardiovascular negative cardio ROS Normal cardiovascular exam Rhythm:regular Rate:Normal     Neuro/Psych negative neurological ROS  negative psych ROS   GI/Hepatic negative GI ROS, Neg liver ROS,   Endo/Other  negative endocrine ROS  Renal/GU negative Renal ROS  negative genitourinary   Musculoskeletal scoliosis   Abdominal   Peds  Hematology negative hematology ROS (+)   Anesthesia Other Findings   Reproductive/Obstetrics (+) Pregnancy                             Anesthesia Physical Anesthesia Plan  ASA: II  Anesthesia Plan: Epidural   Post-op Pain Management:    Induction:   PONV Risk Score and Plan:   Airway Management Planned:   Additional Equipment:   Intra-op Plan:   Post-operative Plan:   Informed Consent: I have reviewed the patients History and Physical, chart, labs and discussed the procedure including the risks, benefits and alternatives for the proposed anesthesia with the patient or authorized representative who has indicated his/her understanding and acceptance.       Plan Discussed with:   Anesthesia Plan Comments:         Anesthesia Quick Evaluation

## 2019-02-22 NOTE — Lactation Note (Signed)
This note was copied from a baby's chart. Lactation Consultation Note  Patient Name: Carrie Willis [id number] Date: 02/22/2019 Reason for consult: Initial assessment;Early term 37-38.6wks;Primapara;1st time breastfeeding  P1 mother whose infant is now 65 hours old.  Baby was sleeping in mother's arms when I arrived.  Mother had no questions/concerns related to breast feeding at this time.  Baby has latched once since delivery.  Encouraged to feed 8-12 times/24 hours or sooner if baby shows feeding cues.  Mother is familiar with feeding cues and hand expression.  Colostrum container provided and milk storage times reviewed.  Finger feeding demonstrated.    Mother will be a "stay at home" mother and is a [facility] participant.  Encouraged mother to call for latch assistance as needed.   Maternal Data Formula Feeding for Exclusion: No Has patient been taught Hand Expression?: Yes Does the patient have breastfeeding experience prior to this delivery?: No  Feeding    LATCH Score                   Interventions    Lactation Tools Discussed/Used WIC Program: Yes   Consult Status Consult Status: Follow-up Date: 02/23/19 Follow-up type: In-patient    Dora Sims 02/22/2019, 5:38 PM

## 2019-02-22 NOTE — MAU Note (Signed)
Presents with c/o increased watery leakage since 2100 yesterday. Pt states,"has been having leakage for approx  1wk. Pt seen in MAU two days ago with neg ROM. Pos FM. No vaginal bldg.   Adah Perl RN

## 2019-02-23 LAB — CBC
HCT: 28.5 % — ABNORMAL LOW (ref 36.0–46.0)
Hemoglobin: 9.5 g/dL — ABNORMAL LOW (ref 12.0–15.0)
MCH: 29.1 pg (ref 26.0–34.0)
MCHC: 33.3 g/dL (ref 30.0–36.0)
MCV: 87.2 fL (ref 80.0–100.0)
Platelets: 147 10*3/uL — ABNORMAL LOW (ref 150–400)
RBC: 3.27 MIL/uL — ABNORMAL LOW (ref 3.87–5.11)
RDW: 12.9 % (ref 11.5–15.5)
WBC: 10 10*3/uL (ref 4.0–10.5)
nRBC: 0 % (ref 0.0–0.2)

## 2019-02-23 LAB — ABO/RH: ABO/RH(D): O POS

## 2019-02-23 NOTE — Progress Notes (Signed)
POSTPARTUM PROGRESS NOTE  Post Partum Day 1  Subjective:  Carrie Willis is a 23 y.o. [id number] s/p SVD at [redacted]w[redacted]d.  She reports she is doing well. No acute events overnight. She denies any problems with ambulating, voiding or po intake. Denies nausea or vomiting.  Pain is well controlled.  Lochia is mild. Denies any lightheadedness or dizziness with ambulation.   Objective: Blood pressure 118/66, pulse 86, temperature 98.2 F (36.8 C), temperature source Oral, resp. rate 16, height 5' 2.5" (1.588 m), weight 77.6 kg, SpO2 99 %, unknown if currently breastfeeding.  Physical Exam:  General: alert, cooperative and no distress Chest: no respiratory distress Heart:regular rate, distal pulses intact Abdomen: soft, nontender,  Uterine Fundus: firm, appropriately tender DVT Evaluation: No calf swelling or tenderness Extremities: No LE edema Skin: warm, dry  Recent Labs    02/22/19 1356 02/23/19 0437  HGB 11.9* 9.5*  HCT 35.8* 28.5*    Assessment/Plan: Carrie Willis is a 23 y.o. [id number] s/p SVD at [redacted]w[redacted]d   PPD#1 - Doing well s/p PPH. Currently asymptomatic. Hemoglobin stable.   Routine postpartum care Contraception: declines Feeding: Breast Dispo: Although stable, given PPH, will monitor for an additional day. Plan for discharge tomorrow.    LOS: 1 day   Orpah Cobb, D.O. [facility], PGY1  02/23/2019, 8:58 AM

## 2019-02-23 NOTE — Lactation Note (Addendum)
This note was copied from a baby's chart. Lactation Consultation Note  Patient Name: Carrie Willis Date: 02/23/2019  Baby Carrie Vineyard now 26 hours old.  Mom reports she just finished breastfeeding and she feels they are breastfeeding well.  Mom reports left nipple is a little sore.  Infant laying on mom and still doing some rooting.  Discussed hunger cues and asked if I could show parents how to hand express and spoon feed. Parents agreed. Demo hand expression with mom ans she is able to get out small amounts and dad fed back to baby.  Infant still cuing.  Asked mom to show me how she was latching her. Mom attempted to latch in cradle hold.  Stopped her and switched her to cross cradle hold.  Mom struggles to keep infant in close and wants to move her hand on top of head.  Laid mom back slightly and encouraged mom to have baby touching her tummy. Enc tummy to mummy. Urged her to continue to hold breasts in c hold/u hold for cross cradle. Mom reports a little more comfort but still tender.  Mom Still letting her slide off the breast and down to nipple tip.  Repositioned her several times.  Dad started assisting with helping mom to keep her in close and put pillows for support. Left mom and baby breasfeeding,.  Urged hand express and rub breastmilk on nipples/hand express prior to latching and hand express and spoon feed back all expressed mothers milk.  Mom to call lactation as needed.   Maternal Data    Feeding    LATCH Score                   Interventions    Lactation Tools Discussed/Used     Consult Status      [name] Michaelle Copas 02/23/2019, 1:42 PM

## 2019-02-24 MED ORDER — IBUPROFEN 600 MG PO TABS: 600.0000 mg | ORAL_TABLET | Freq: Four times a day (QID) | ORAL | 0 refills | Status: DC

## 2019-02-24 MED ORDER — SENNOSIDES-DOCUSATE SODIUM 8.6-50 MG PO TABS: 2.0000 | ORAL_TABLET | ORAL | 0 refills | Status: DC

## 2019-02-24 MED ORDER — TETANUS-DIPHTH-ACELL PERTUSSIS 5-2.5-18.5 LF-MCG/0.5 IM SUSP: 0.5000 mL | Freq: Once | INTRAMUSCULAR | 0 refills | Status: AC

## 2019-02-24 NOTE — Lactation Note (Signed)
This note was copied from a baby's chart. Lactation Consultation Note: Mom reports baby has just finished feeding for 20 min. Asleep on mom's chest at present. Reports baby has been nursing well, no pain and feels breasts are beginning to feel fuller this morning. Has manual pump for home. Plans to get DEBP from [facility] for back to work. Reviewed engorgement prevention and treatment. No questions at present. Reviewed our phone number, and OP appointments as resources for support after DC.   Patient Name: Girl Ellamarie Triano [id number] Date: 02/24/2019 Reason for consult: Follow-up assessment   Maternal Data Formula Feeding for Exclusion: No Has patient been taught Hand Expression?: Yes Does the patient have breastfeeding experience prior to this delivery?: No  Feeding Feeding Type: Breast Fed  LATCH Score  Interventions    Lactation Tools Discussed/Used WIC Program: Yes   Consult Status Consult Status: Complete    Pamelia Hoit 02/24/2019, 9:05 AM

## 2019-02-24 NOTE — Discharge Instructions (Signed)
Vaginal Delivery, Care After °Refer to this sheet in the next few weeks. These instructions provide you with information about caring for yourself after vaginal delivery. Your health care provider may also give you more specific instructions. Your treatment has been planned according to current medical practices, but problems sometimes occur. Call your health care provider if you have any problems or questions. °What can I expect after the procedure? °After vaginal delivery, it is common to have: °· Some bleeding from your vagina. °· Soreness in your abdomen, your vagina, and the area of skin between your vaginal opening and your anus (perineum). °· Pelvic cramps. °· Fatigue. °Follow these instructions at home: °Medicines °· Take over-the-counter and prescription medicines only as told by your health care provider. °· If you were prescribed an antibiotic medicine, take it as told by your health care provider. Do not stop taking the antibiotic until it is finished. °Driving ° °· Do not drive or operate heavy machinery while taking prescription pain medicine. °· Do not drive for 24 hours if you received a sedative. °Lifestyle °· Do not drink alcohol. This is especially important if you are breastfeeding or taking medicine to relieve pain. °· Do not use tobacco products, including cigarettes, chewing tobacco, or e-cigarettes. If you need help quitting, ask your health care provider. °Eating and drinking °· Drink at least 8 eight-ounce glasses of water every day unless you are told not to by your health care provider. If you choose to breastfeed your baby, you may need to drink more water than this. °· Eat high-fiber foods every day. These foods may help prevent or relieve constipation. High-fiber foods include: °? Whole grain cereals and breads. °? Brown rice. °? Beans. °? Fresh fruits and vegetables. °Activity °· Return to your normal activities as told by your health care provider. Ask your health care provider what  activities are safe for you. °· Rest as much as possible. Try to rest or take a nap when your baby is sleeping. °· Do not lift anything that is heavier than your baby or 10 lb (4.5 kg) until your health care provider says that it is safe. °· Talk with your health care provider about when you can engage in sexual activity. This may depend on your: °? Risk of infection. °? Rate of healing. °? Comfort and desire to engage in sexual activity. °Vaginal Care °· If you have an episiotomy or a vaginal tear, check the area every day for signs of infection. Check for: °? More redness, swelling, or pain. °? More fluid or blood. °? Warmth. °? Pus or a bad smell. °· Do not use tampons or douches until your health care provider says this is safe. °· Watch for any blood clots that may pass from your vagina. These may look like clumps of dark red, brown, or black discharge. °General instructions °· Keep your perineum clean and dry as told by your health care provider. °· Wear loose, comfortable clothing. °· Wipe from front to back when you use the toilet. °· Ask your health care provider if you can shower or take a bath. If you had an episiotomy or a perineal tear during labor and delivery, your health care provider may tell you not to take baths for a certain length of time. °· Wear a bra that supports your breasts and fits you well. °· If possible, have someone help you with household activities and help care for your baby for at least a few days after you   leave the hospital. °· Keep all follow-up visits for you and your baby as told by your health care provider. This is important. °Contact a health care provider if: °· You have: °? Vaginal discharge that has a bad smell. °? Difficulty urinating. °? Pain when urinating. °? A sudden increase or decrease in the frequency of your bowel movements. °? More redness, swelling, or pain around your episiotomy or vaginal tear. °? More fluid or blood coming from your episiotomy or vaginal  tear. °? Pus or a bad smell coming from your episiotomy or vaginal tear. °? A fever. °? A rash. °? Little or no interest in activities you used to enjoy. °? Questions about caring for yourself or your baby. °· Your episiotomy or vaginal tear feels warm to the touch. °· Your episiotomy or vaginal tear is separating or does not appear to be healing. °· Your breasts are painful, hard, or turn red. °· You feel unusually sad or worried. °· You feel nauseous or you vomit. °· You pass large blood clots from your vagina. If you pass a blood clot from your vagina, save it to show to your health care provider. Do not flush blood clots down the toilet without having your health care provider look at them. °· You urinate more than usual. °· You are dizzy or light-headed. °· You have not breastfed at all and you have not had a menstrual period for 12 weeks after delivery. °· You have stopped breastfeeding and you have not had a menstrual period for 12 weeks after you stopped breastfeeding. °Get help right away if: °· You have: °? Pain that does not go away or does not get better with medicine. °? Chest pain. °? Difficulty breathing. °? Blurred vision or spots in your vision. °? Thoughts about hurting yourself or your baby. °· You develop pain in your abdomen or in one of your legs. °· You develop a severe headache. °· You faint. °· You bleed from your vagina so much that you fill two sanitary pads in one hour. °This information is not intended to replace advice given to you by your health care provider. Make sure you discuss any questions you have with your health care provider. °Document Released: 11/23/2000 Document Revised: 05/09/2016 Document Reviewed: 12/11/2015 °Elsevier Interactive Patient Education © 2019 [employer] ° °

## 2021-04-17 ENCOUNTER — Emergency Department (HOSPITAL_COMMUNITY)
Admission: EM | Admit: 2021-04-17 | Discharge: 2021-04-17 | Disposition: A | Discharge status: Home / Self Care | Payer: Medicaid Other | Attending: Emergency Medicine | Admitting: Emergency Medicine

## 2021-04-17 ENCOUNTER — Other Ambulatory Visit: Payer: Self-pay

## 2021-04-17 DIAGNOSIS — R220 Localized swelling, mass and lump, head: Secondary | ICD-10-CM

## 2021-04-17 DIAGNOSIS — R609 Edema, unspecified: Secondary | ICD-10-CM | POA: Diagnosis not present

## 2021-04-17 LAB — CBC
HCT: 42 % (ref 36.0–46.0)
Hemoglobin: 13.5 g/dL (ref 12.0–15.0)
MCH: 26.8 pg (ref 26.0–34.0)
MCHC: 32.1 g/dL (ref 30.0–36.0)
MCV: 83.5 fL (ref 80.0–100.0)
Platelets: 275 10*3/uL (ref 150–400)
RBC: 5.03 MIL/uL (ref 3.87–5.11)
RDW: 13.2 % (ref 11.5–15.5)
WBC: 7.1 10*3/uL (ref 4.0–10.5)
nRBC: 0 % (ref 0.0–0.2)

## 2021-04-17 LAB — BASIC METABOLIC PANEL
Anion gap: 7 (ref 5–15)
BUN: 8 mg/dL (ref 6–20)
CO2: 23 mmol/L (ref 22–32)
Calcium: 9.2 mg/dL (ref 8.9–10.3)
Chloride: 105 mmol/L (ref 98–111)
Creatinine, Ser: 0.69 mg/dL (ref 0.44–1.00)
GFR, Estimated: >60 mL/min (ref >60)
Glucose, Bld: 124 mg/dL — ABNORMAL HIGH (ref 70–99)
Potassium: 4 mmol/L (ref 3.5–5.1)
Sodium: 135 mmol/L (ref 135–145)

## 2021-04-17 NOTE — Discharge Instructions (Addendum)
Also mentioned that you can follow-up with [facility] urgent care.  Information for follow-up with the wellness clinic provided.  Return for any new or worse symptoms.  Labs today without any abnormalities.
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
## 2021-04-17 NOTE — ED Triage Notes (Signed)
Pt presents to the ED with swelling to both cheekbones. No swelling assessed. Pt denies pain. Patent airway.

## 2021-04-17 NOTE — ED Provider Notes (Signed)
Emergency Medicine Provider Triage Evaluation Note  DEBANHI BLAKER , a 25 y.o. female  was evaluated in triage.  Pt complains of bilateral facial swelling x 1 week without dental complaints. Feels like her cheek bones are swollen but does not see any swelling, no pain with eating or with touch, no fevers. .  Review of Systems  Positive: Facial pain Negative: Fever, dental problem  Physical Exam  There were no vitals taken for this visit. Gen:   Awake, no distress   Resp:  Normal effort  MSK:   Moves extremities without difficulty  Other:    Medical Decision Making  Medically screening exam initiated at 12:25 PM.  Appropriate orders placed.  TANELLE LANZO was informed that the remainder of the evaluation will be completed by another provider, this initial triage assessment does not replace that evaluation, and the importance of remaining in the ED until their evaluation is complete.     Jeannie Fend, PA-C 04/17/21 1226    Bethann Berkshire, MD 04/17/21 1254

## 2021-04-20 ENCOUNTER — Ambulatory Visit: Payer: Medicaid Other | Admitting: Internal Medicine

## 2021-07-05 ENCOUNTER — Ambulatory Visit (INDEPENDENT_AMBULATORY_CARE_PROVIDER_SITE_OTHER): Payer: Medicaid Other | Admitting: Nurse Practitioner

## 2021-11-10 ENCOUNTER — Ambulatory Visit: Payer: Medicaid Other | Admitting: Nurse Practitioner

## 2023-10-01 ENCOUNTER — Ambulatory Visit (INDEPENDENT_AMBULATORY_CARE_PROVIDER_SITE_OTHER): Payer: Medicaid Other | Admitting: Obstetrics and Gynecology

## 2023-10-01 ENCOUNTER — Encounter: Payer: Self-pay | Admitting: Obstetrics and Gynecology

## 2023-10-01 VITALS — BP 112/73 | HR 101 | Wt 208.0 lb

## 2023-10-01 DIAGNOSIS — Z1339 Encounter for screening examination for other mental health and behavioral disorders: Secondary | ICD-10-CM

## 2023-10-01 DIAGNOSIS — Z3482 Encounter for supervision of other normal pregnancy, second trimester: Secondary | ICD-10-CM

## 2023-10-01 DIAGNOSIS — Z3481 Encounter for supervision of other normal pregnancy, first trimester: Secondary | ICD-10-CM

## 2023-10-01 DIAGNOSIS — Z3490 Encounter for supervision of normal pregnancy, unspecified, unspecified trimester: Secondary | ICD-10-CM | POA: Insufficient documentation

## 2023-10-01 DIAGNOSIS — Z3A13 13 weeks gestation of pregnancy: Secondary | ICD-10-CM

## 2023-10-01 DIAGNOSIS — M419 Scoliosis, unspecified: Secondary | ICD-10-CM | POA: Diagnosis not present

## 2023-10-01 DIAGNOSIS — Z3402 Encounter for supervision of normal first pregnancy, second trimester: Secondary | ICD-10-CM

## 2023-10-01 MED ORDER — FAMOTIDINE 20 MG PO TABS: 20.0000 mg | ORAL_TABLET | Freq: Two times a day (BID) | ORAL | 1 refills | Status: AC

## 2023-10-01 NOTE — Progress Notes (Signed)
Pt given info on [employer] 4 moms Doula
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
## 2023-10-01 NOTE — Progress Notes (Signed)
Last pap- 04/27/21- negative

## 2023-10-02 LAB — COMPREHENSIVE METABOLIC PANEL
ALT: 10 [IU]/L (ref 0–32)
AST: 14 [IU]/L (ref 0–40)
Albumin: 3.7 g/dL — ABNORMAL LOW (ref 4.0–5.0)
Alkaline Phosphatase: 59 [IU]/L (ref 44–121)
BUN/Creatinine Ratio: 8 — ABNORMAL LOW (ref 9–23)
BUN: 6 mg/dL (ref 6–20)
Bilirubin Total: <0.2 mg/dL (ref 0.0–1.2)
CO2: 20 mmol/L (ref 20–29)
Calcium: 9.2 mg/dL (ref 8.7–10.2)
Chloride: 103 mmol/L (ref 96–106)
Creatinine, Ser: 0.73 mg/dL (ref 0.57–1.00)
Globulin, Total: 3.1 g/dL (ref 1.5–4.5)
Glucose: 80 mg/dL (ref 70–99)
Potassium: 3.9 mmol/L (ref 3.5–5.2)
Sodium: 136 mmol/L (ref 134–144)
Total Protein: 6.8 g/dL (ref 6.0–8.5)
eGFR: 114 mL/min/{1.73_m2} (ref >59)

## 2023-10-02 LAB — TSH: TSH: 0.451 u[IU]/mL (ref 0.450–4.500)

## 2023-10-02 LAB — HEMOGLOBIN A1C
Est. average glucose Bld gHb Est-mCnc: 128 mg/dL
Hgb A1c MFr Bld: 6.1 % — ABNORMAL HIGH (ref 4.8–5.6)

## 2023-10-02 LAB — SPECIMEN STATUS REPORT

## 2023-10-04 LAB — PREGNANCY, INITIAL SCREEN
Antibody Screen: NEGATIVE
Basophils Absolute: 0 10*3/uL (ref 0.0–0.2)
Basos: 0 %
Bilirubin, UA: NEGATIVE
Chlamydia trachomatis, NAA: NEGATIVE
EOS (ABSOLUTE): 0.2 10*3/uL (ref 0.0–0.4)
Eos: 2 %
Glucose, UA: NEGATIVE
HCV Ab: NONREACTIVE
HIV Screen 4th Generation wRfx: NONREACTIVE
Hematocrit: 35.6 % (ref 34.0–46.6)
Hemoglobin: 10.7 g/dL — ABNORMAL LOW (ref 11.1–15.9)
Hepatitis B Surface Ag: NEGATIVE
Immature Grans (Abs): 0 10*3/uL (ref 0.0–0.1)
Immature Granulocytes: 0 %
Ketones, UA: NEGATIVE
Lymphocytes Absolute: 2.9 10*3/uL (ref 0.7–3.1)
Lymphs: 33 %
MCH: 22.1 pg — ABNORMAL LOW (ref 26.6–33.0)
MCHC: 30.1 g/dL — ABNORMAL LOW (ref 31.5–35.7)
MCV: 73 fL — ABNORMAL LOW (ref 79–97)
Monocytes Absolute: 0.4 10*3/uL (ref 0.1–0.9)
Monocytes: 5 %
Neisseria Gonorrhoeae by PCR: NEGATIVE
Neutrophils Absolute: 5.3 10*3/uL (ref 1.4–7.0)
Neutrophils: 60 %
Nitrite, UA: NEGATIVE
Platelets: 327 10*3/uL (ref 150–450)
Protein,UA: NEGATIVE
RBC, UA: NEGATIVE
RBC: 4.85 x10E6/uL (ref 3.77–5.28)
RDW: 16.4 % — ABNORMAL HIGH (ref 11.7–15.4)
RPR Ser Ql: NONREACTIVE
Rh Factor: POSITIVE
Rubella Antibodies, IGG: 5.69 {index} (ref >0.99)
Specific Gravity, UA: 1.012 (ref 1.005–1.030)
Urobilinogen, Ur: 0.2 mg/dL (ref 0.2–1.0)
WBC: 8.8 10*3/uL (ref 3.4–10.8)
pH, UA: 6 (ref 5.0–7.5)

## 2023-10-04 LAB — URINE CULTURE, OB REFLEX

## 2023-10-04 LAB — MICROSCOPIC EXAMINATION
Casts: NONE SEEN /[LPF]
RBC, Urine: NONE SEEN /[HPF] (ref 0–2)

## 2023-10-04 LAB — HCV INTERPRETATION

## 2023-10-06 ENCOUNTER — Encounter: Payer: Self-pay | Admitting: Obstetrics and Gynecology

## 2023-10-06 DIAGNOSIS — R7303 Prediabetes: Secondary | ICD-10-CM | POA: Insufficient documentation

## 2023-10-17 LAB — HORIZON CUSTOM: REPORT SUMMARY: POSITIVE — AB

## 2023-10-18 ENCOUNTER — Other Ambulatory Visit: Payer: Self-pay | Admitting: *Deleted

## 2023-10-18 DIAGNOSIS — O285 Abnormal chromosomal and genetic finding on antenatal screening of mother: Secondary | ICD-10-CM

## 2023-10-18 NOTE — Progress Notes (Signed)
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  Amb referral placed for genetics.

## 2023-10-28 ENCOUNTER — Encounter: Payer: Self-pay | Admitting: Obstetrics and Gynecology

## 2023-10-28 DIAGNOSIS — Z148 Genetic carrier of other disease: Secondary | ICD-10-CM | POA: Insufficient documentation

## 2023-10-31 ENCOUNTER — Ambulatory Visit (INDEPENDENT_AMBULATORY_CARE_PROVIDER_SITE_OTHER): Payer: Medicaid Other | Admitting: Obstetrics and Gynecology

## 2023-10-31 VITALS — BP 112/75 | HR 93 | Wt 203.0 lb

## 2023-10-31 DIAGNOSIS — R7303 Prediabetes: Secondary | ICD-10-CM

## 2023-10-31 DIAGNOSIS — Z3A17 17 weeks gestation of pregnancy: Secondary | ICD-10-CM

## 2023-10-31 DIAGNOSIS — I82890 Acute embolism and thrombosis of other specified veins: Secondary | ICD-10-CM

## 2023-10-31 DIAGNOSIS — Z3490 Encounter for supervision of normal pregnancy, unspecified, unspecified trimester: Secondary | ICD-10-CM

## 2023-10-31 DIAGNOSIS — Z8759 Personal history of other complications of pregnancy, childbirth and the puerperium: Secondary | ICD-10-CM

## 2023-10-31 DIAGNOSIS — Z148 Genetic carrier of other disease: Secondary | ICD-10-CM

## 2023-10-31 DIAGNOSIS — O364XX0 Maternal care for intrauterine death, not applicable or unspecified: Secondary | ICD-10-CM

## 2023-10-31 DIAGNOSIS — M419 Scoliosis, unspecified: Secondary | ICD-10-CM

## 2023-10-31 NOTE — Progress Notes (Signed)
   PRENATAL VISIT NOTE  Subjective:  Carrie Willis is a 27 y.o. [phone] at [redacted]w[redacted]d being seen today for ongoing prenatal care.  She is currently monitored for the following issues for this high-risk pregnancy and has Obstetric trauma, unspecified; IUFD at 20 weeks or more of gestation; History of placenta abruption; Scoliosis; Superficial vein thrombosis; Postpartum hemorrhage; Supervision of normal pregnancy; Prediabetes; and Carrier of spinal muscular atrophy on their problem list.  Patient reports no complaints.  Contractions: Not present. Vag. Bleeding: None.  Movement: Absent. Denies leaking of fluid.   The following portions of the patient's history were reviewed and updated as appropriate: allergies, current medications, past family history, past medical history, past social history, past surgical history and problem list.   Objective:   Vitals:   10/31/23 0821  BP: 112/75  Pulse: 93  Weight: 203 lb (92.1 kg)    Fetal Status: Fetal Heart Rate (bpm): 150   Movement: Absent     General:  Alert, oriented and cooperative. Patient is in no acute distress.  Skin: Skin is warm and dry. No rash noted.   Cardiovascular: Normal heart rate noted  Respiratory: Normal respiratory effort, no problems with respiration noted  Abdomen: Soft, gravid, appropriate for gestational age.  Pain/Pressure: Absent      Assessment and Plan:  Pregnancy: [id number] at [redacted]w[redacted]d 1. Encounter for supervision of normal pregnancy, antepartum, unspecified gravidity 2. [redacted] weeks gestation of pregnancy Awaiting results of panorama [place] scheduled 12/9  3. Prediabetes Early A1c 6.1 - had small amount of coffee with cream this AM, may return to go 2h GTT - Protein / creatinine ratio, urine - Glucose Tolerance, 2 Hours w/1 Hour  4. Superficial vein thrombosis Related to IV site in last pregnancy per patient  5. Postpartum hemorrhage, unspecified type 2020 delivery - QBL 1008 iso atony. Resolved with methergine,  cytotec and TXA. Did not require transfusion  6. History of placenta abruption 7. IUFD at 20 weeks or more of gestation 8. Obstetric trauma, unspecified ldASA In prior pregnancy, pt had serial growth [place], weekly antenatal testing at 34 weeks, and delivery at 39 weeks was recommended  9. Carrier of spinal muscular atrophy Partner is not available for testing Has appt scheduled with genetic counseling  10. Scoliosis, unspecified scoliosis type, unspecified spinal region Diagnosed in 2014 by chiropractor. No hardware or rods. No surgeries   Please refer to After Visit Summary for other counseling recommendations.   Return in about 4 weeks (around 11/28/2023) for return OB at 21 weeks.  Future Appointments  Date Time Provider Department Center  11/18/2023  1:15 PM [facility] NURSE [facility]  11/18/2023  1:30 PM [facility] US5 [facility]  11/21/2023  1:50 PM Milas Hock, MD [facility]    Lennart Pall, MD

## 2023-11-01 LAB — PROTEIN / CREATININE RATIO, URINE
Creatinine, Urine: 211.8 mg/dL
Protein, Ur: 18 mg/dL
Protein/Creat Ratio: 85 mg/g{creat} (ref 0–200)

## 2023-11-01 LAB — GLUCOSE TOLERANCE, 2 HOURS W/ 1HR
Glucose, 1 hour: 99 mg/dL (ref 70–179)
Glucose, 2 hour: 122 mg/dL (ref 70–152)
Glucose, Fasting: 85 mg/dL (ref 70–91)

## 2023-11-18 ENCOUNTER — Other Ambulatory Visit: Payer: Self-pay | Admitting: *Deleted

## 2023-11-18 ENCOUNTER — Ambulatory Visit: Payer: Medicaid Other

## 2023-11-18 ENCOUNTER — Ambulatory Visit: Payer: Medicaid Other | Attending: Obstetrics and Gynecology

## 2023-11-18 ENCOUNTER — Other Ambulatory Visit: Payer: Self-pay

## 2023-11-18 ENCOUNTER — Encounter: Payer: Self-pay | Admitting: *Deleted

## 2023-11-18 ENCOUNTER — Ambulatory Visit: Payer: Medicaid Other | Admitting: *Deleted

## 2023-11-18 DIAGNOSIS — O99212 Obesity complicating pregnancy, second trimester: Secondary | ICD-10-CM | POA: Insufficient documentation

## 2023-11-18 DIAGNOSIS — O09292 Supervision of pregnancy with other poor reproductive or obstetric history, second trimester: Secondary | ICD-10-CM | POA: Diagnosis not present

## 2023-11-18 DIAGNOSIS — Z8759 Personal history of other complications of pregnancy, childbirth and the puerperium: Secondary | ICD-10-CM

## 2023-11-18 DIAGNOSIS — Z3490 Encounter for supervision of normal pregnancy, unspecified, unspecified trimester: Secondary | ICD-10-CM | POA: Diagnosis not present

## 2023-11-18 DIAGNOSIS — Z3A19 19 weeks gestation of pregnancy: Secondary | ICD-10-CM | POA: Insufficient documentation

## 2023-11-18 DIAGNOSIS — Z3482 Encounter for supervision of other normal pregnancy, second trimester: Secondary | ICD-10-CM | POA: Insufficient documentation

## 2023-11-18 DIAGNOSIS — Z363 Encounter for antenatal screening for malformations: Secondary | ICD-10-CM | POA: Diagnosis present

## 2023-11-20 NOTE — Progress Notes (Unsigned)
   PRENATAL VISIT NOTE  Subjective:  Carrie Willis is a 27 y.o. [phone] at [redacted]w[redacted]d being seen today for ongoing prenatal care.  She is currently monitored for the following issues for this high-risk pregnancy and has Obstetric trauma, unspecified; IUFD at 20 weeks or more of gestation; History of placenta abruption; Scoliosis; Postpartum hemorrhage; Supervision of normal pregnancy; Prediabetes; Carrier of spinal muscular atrophy; and Coloboma of retina on their problem list.  Patient reports {[id number]}.   .  .   . Denies leaking of fluid.   The following portions of the patient's history were reviewed and updated as appropriate: allergies, current medications, past family history, past medical history, past social history, past surgical history and problem list.   Objective:  There were no vitals filed for this visit.  Fetal Status:           General:  Alert, oriented and cooperative. Patient is in no acute distress.  Skin: Skin is warm and dry. No rash noted.   Cardiovascular: Normal heart rate noted  Respiratory: Normal respiratory effort, no problems with respiration noted  Abdomen: Soft, gravid, appropriate for gestational age.        Pelvic: Cervical exam deferred        Extremities: Normal range of motion.     Mental Status: Normal mood and affect. Normal behavior. Normal judgment and thought content.   Assessment and Plan:  Pregnancy: [id number] at [redacted]w[redacted]d 1. Encounter for supervision of normal pregnancy, antepartum, unspecified gravidity Anatomy normal but incomplete. AFP pending.   2. Prediabetes Normal early 2hr  3. IUFD at 20 weeks or more of gestation Stillbirth at 29 weeks. Unknown etiology. Subsequent FT SVD.  In prior pregnancy, pt had serial growth [place], weekly antenatal testing at 34 weeks, and delivery at 39 weeks was recommended   4. Carrier of spinal muscular atrophy Partner not available for testing. S/p appt with GC.   5. History of placenta abruption  6.  Pregnancy with 20 completed weeks gestation   Preterm labor symptoms and general obstetric precautions including but not limited to vaginal bleeding, contractions, leaking of fluid and fetal movement were reviewed in detail with the patient. Please refer to After Visit Summary for other counseling recommendations.   No follow-ups on file.  Future Appointments  Date Time Provider Department Center  11/21/2023  1:50 PM Milas Hock, MD [facility]  12/23/2023  1:15 PM [facility] NURSE [facility]  12/23/2023  1:30 PM [facility] US4 [facility]    Milas Hock, MD

## 2023-11-21 ENCOUNTER — Ambulatory Visit (INDEPENDENT_AMBULATORY_CARE_PROVIDER_SITE_OTHER): Payer: Medicaid Other | Admitting: Obstetrics and Gynecology

## 2023-11-21 VITALS — BP 106/69 | HR 92 | Wt 201.0 lb

## 2023-11-21 DIAGNOSIS — Z8759 Personal history of other complications of pregnancy, childbirth and the puerperium: Secondary | ICD-10-CM

## 2023-11-21 DIAGNOSIS — Z148 Genetic carrier of other disease: Secondary | ICD-10-CM

## 2023-11-21 DIAGNOSIS — Z3490 Encounter for supervision of normal pregnancy, unspecified, unspecified trimester: Secondary | ICD-10-CM

## 2023-11-21 DIAGNOSIS — O09892 Supervision of other high risk pregnancies, second trimester: Secondary | ICD-10-CM

## 2023-11-21 DIAGNOSIS — O364XX0 Maternal care for intrauterine death, not applicable or unspecified: Secondary | ICD-10-CM

## 2023-11-21 DIAGNOSIS — Z3A20 20 weeks gestation of pregnancy: Secondary | ICD-10-CM

## 2023-11-21 DIAGNOSIS — O99810 Abnormal glucose complicating pregnancy: Secondary | ICD-10-CM

## 2023-11-21 DIAGNOSIS — R7303 Prediabetes: Secondary | ICD-10-CM

## 2023-11-23 LAB — AFP, SERUM, OPEN SPINA BIFIDA
AFP MoM: 0.81
AFP Value: 41.3 ng/mL
Gest. Age on Collection Date: 20 wk
Maternal Age At EDD: 29.6 a
OSBR Risk 1 IN: 10000
Test Results:: NEGATIVE
Weight: 201 [lb_av]

## 2023-11-24 LAB — PANORAMA PRENATAL TEST FULL PANEL:PANORAMA TEST PLUS 5 ADDITIONAL MICRODELETIONS: FETAL FRACTION: 6

## 2023-11-25 ENCOUNTER — Telehealth: Payer: Self-pay

## 2023-11-25 NOTE — Telephone Encounter (Signed)
 I spoke with the patient to return her recent Panorama cell-free DNA (cfDNA) screening results. The result is low risk, consistent with a female fetus. This screening significantly reduces but does not eliminate the chance that the current pregnancy has Down syndrome (trisomy 60), trisomy 66, trisomy 42, and common sex chromosome conditions. Please see report for details. Additionally, there are many genetic conditions that cannot be detected by cfDNA.    Sheppard Plumber, MS Genetic Counselor [facility] [phone]

## 2023-12-11 NOTE — L&D Delivery Note (Signed)
 Patient: Carrie Willis MRN: [id number]  GBS status: presumptive negative, IAP given no  Patient is a 28 y.o. now G4P2 s/p NSVD at [redacted]w[redacted]d, who was admitted for IOL. AROM 1h 31m prior to delivery with clear fluid.    Delivery Note At 11:55 AM a viable female was delivered via Vaginal, Spontaneous (Presentation: Left Occiput Anterior).  APGAR: 8, 9; weight  .   Placenta status: Spontaneous, Intact.  Cord: 3 vessels with the following complications: None.  Cord pH: n/a  Anesthesia: Epidural Episiotomy: None Lacerations: 1st degree;Labial Suture Repair: 3.0 monocryl Est. Blood Loss (mL):  100  Mom to postpartum.  Baby to Couplet care / Skin to Skin.  Scheryl Darter 03/20/2024, 12:32 PM     Head delivered LOA. No nuchal cord present. Shoulder and body delivered in usual fashion. Infant with spontaneous cry, placed on mother's abdomen, dried and bulb suctioned. Cord clamped x 2 after 1-minute delay, and cut by family member. Cord blood drawn. Placenta delivered spontaneously with gentle cord traction. Fundus firm with massage and Pitocin. Perineum inspected and found to have l labial laceration, which was found to be which was repaired with suture with good hemostasis achieved.  Adam Phenix, MD

## 2023-12-18 NOTE — Progress Notes (Signed)
   PRENATAL VISIT NOTE  Subjective:  Carrie Willis is a 28 y.o. [phone] at [redacted]w[redacted]d being seen today for ongoing prenatal care.  She is currently monitored for the following issues for this high-risk pregnancy and has Obstetric trauma, unspecified; IUFD at 20 weeks or more of gestation; History of placenta abruption; Scoliosis; Postpartum hemorrhage; Supervision of normal pregnancy; Prediabetes; Carrier of spinal muscular atrophy; and Coloboma of retina on their problem list.  Patient reports fatigue.  Contractions: Not present. Vag. Bleeding: None.  Movement: Present. Denies leaking of fluid.   The following portions of the patient's history were reviewed and updated as appropriate: allergies, current medications, past family history, past medical history, past social history, past surgical history and problem list.   Objective:   Vitals:   12/19/23 1309  BP: 104/70  Pulse: 88  Weight: 202 lb (91.6 kg)    Fetal Status: Fetal Heart Rate (bpm): 153 Fundal Height: 25 cm Movement: Present     General:  Alert, oriented and cooperative. Patient is in no acute distress.  Skin: Skin is warm and dry. No rash noted.   Cardiovascular: Normal heart rate noted  Respiratory: Normal respiratory effort, no problems with respiration noted  Abdomen: Soft, gravid, appropriate for gestational age.  Pain/Pressure: Absent     Pelvic: Cervical exam deferred        Extremities: Normal range of motion.  Edema: None  Mental Status: Normal mood and affect. Normal behavior. Normal judgment and thought content.   Assessment and Plan:  Pregnancy: [id number] at [redacted]w[redacted]d 1. Encounter for supervision of normal pregnancy, antepartum, unspecified gravidity (Primary) 28w labs next visit.  Has f/u anatomy on 1/13.   2. IUFD at 20 weeks or more of gestation Stillbirth at 32 weeks. Unknown etiology. Subsequent FT SVD.  In prior pregnancy, pt had serial growth US , weekly antenatal testing at 34 weeks, and delivery at 39  weeks was recommended   3. Prediabetes Normal early 2 hr  4. Postpartum hemorrhage, unspecified type See PL  5. Pregnancy with 24 completed weeks gestation   Preterm labor symptoms and general obstetric precautions including but not limited to vaginal bleeding, contractions, leaking of fluid and fetal movement were reviewed in detail with the patient. Please refer to After Visit Summary for other counseling recommendations.   Return in about 4 weeks (around 01/16/2024) for OB VISIT, MD or APP, 2 hr GTT/28w labs.  Future Appointments  Date Time Provider Department Center  12/23/2023  1:15 PM [facility] NURSE [facility]  12/23/2023  1:30 PM [facility] US4 [facility]    Vina Solian, MD

## 2023-12-19 ENCOUNTER — Ambulatory Visit (INDEPENDENT_AMBULATORY_CARE_PROVIDER_SITE_OTHER): Payer: Medicaid Other | Admitting: Obstetrics and Gynecology

## 2023-12-19 VITALS — BP 104/70 | HR 88 | Wt 202.0 lb

## 2023-12-19 DIAGNOSIS — Z3A24 24 weeks gestation of pregnancy: Secondary | ICD-10-CM

## 2023-12-19 DIAGNOSIS — R7303 Prediabetes: Secondary | ICD-10-CM

## 2023-12-19 DIAGNOSIS — Z3490 Encounter for supervision of normal pregnancy, unspecified, unspecified trimester: Secondary | ICD-10-CM

## 2023-12-19 DIAGNOSIS — O364XX0 Maternal care for intrauterine death, not applicable or unspecified: Secondary | ICD-10-CM

## 2023-12-23 ENCOUNTER — Ambulatory Visit: Payer: Medicaid Other | Admitting: *Deleted

## 2023-12-23 ENCOUNTER — Other Ambulatory Visit: Payer: Self-pay

## 2023-12-23 ENCOUNTER — Encounter: Payer: Self-pay | Admitting: *Deleted

## 2023-12-23 ENCOUNTER — Ambulatory Visit: Payer: Medicaid Other | Attending: Obstetrics

## 2023-12-23 DIAGNOSIS — O09292 Supervision of pregnancy with other poor reproductive or obstetric history, second trimester: Secondary | ICD-10-CM | POA: Diagnosis not present

## 2023-12-23 DIAGNOSIS — O99212 Obesity complicating pregnancy, second trimester: Secondary | ICD-10-CM

## 2023-12-23 DIAGNOSIS — Z3A24 24 weeks gestation of pregnancy: Secondary | ICD-10-CM

## 2023-12-23 DIAGNOSIS — Z8759 Personal history of other complications of pregnancy, childbirth and the puerperium: Secondary | ICD-10-CM | POA: Diagnosis present

## 2023-12-23 DIAGNOSIS — E669 Obesity, unspecified: Secondary | ICD-10-CM

## 2023-12-24 ENCOUNTER — Other Ambulatory Visit: Payer: Self-pay | Admitting: *Deleted

## 2023-12-24 DIAGNOSIS — Z8759 Personal history of other complications of pregnancy, childbirth and the puerperium: Secondary | ICD-10-CM

## 2024-01-17 ENCOUNTER — Ambulatory Visit (INDEPENDENT_AMBULATORY_CARE_PROVIDER_SITE_OTHER): Payer: Medicaid Other | Admitting: Obstetrics and Gynecology

## 2024-01-17 VITALS — BP 110/71 | HR 94 | Wt 201.0 lb

## 2024-01-17 DIAGNOSIS — Z3483 Encounter for supervision of other normal pregnancy, third trimester: Secondary | ICD-10-CM | POA: Diagnosis not present

## 2024-01-17 DIAGNOSIS — M419 Scoliosis, unspecified: Secondary | ICD-10-CM

## 2024-01-17 DIAGNOSIS — Z23 Encounter for immunization: Secondary | ICD-10-CM

## 2024-01-17 DIAGNOSIS — Z3A28 28 weeks gestation of pregnancy: Secondary | ICD-10-CM | POA: Diagnosis not present

## 2024-01-17 DIAGNOSIS — Z3490 Encounter for supervision of normal pregnancy, unspecified, unspecified trimester: Secondary | ICD-10-CM
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
## 2024-01-18 LAB — RPR: RPR Ser Ql: NONREACTIVE

## 2024-01-18 LAB — CBC
Hematocrit: 34.4 % (ref 34.0–46.6)
Hemoglobin: 10.5 g/dL — ABNORMAL LOW (ref 11.1–15.9)
MCH: 22.5 pg — ABNORMAL LOW (ref 26.6–33.0)
MCHC: 30.5 g/dL — ABNORMAL LOW (ref 31.5–35.7)
MCV: 74 fL — ABNORMAL LOW (ref 79–97)
Platelets: 294 10*3/uL (ref 150–450)
RBC: 4.66 x10E6/uL (ref 3.77–5.28)
RDW: 16.3 % — ABNORMAL HIGH (ref 11.7–15.4)
WBC: 8.5 10*3/uL (ref 3.4–10.8)

## 2024-01-18 LAB — GLUCOSE TOLERANCE, 2 HOURS W/ 1HR
Glucose, 1 hour: 85 mg/dL (ref 70–179)
Glucose, 2 hour: 107 mg/dL (ref 70–152)
Glucose, Fasting: 84 mg/dL (ref 70–91)

## 2024-01-18 LAB — HIV ANTIBODY (ROUTINE TESTING W REFLEX): HIV Screen 4th Generation wRfx: NONREACTIVE

## 2024-01-27 ENCOUNTER — Other Ambulatory Visit: Payer: Self-pay | Admitting: *Deleted

## 2024-01-27 ENCOUNTER — Ambulatory Visit: Payer: Medicaid Other | Attending: Obstetrics

## 2024-01-27 DIAGNOSIS — Z8759 Personal history of other complications of pregnancy, childbirth and the puerperium: Secondary | ICD-10-CM

## 2024-01-27 DIAGNOSIS — O99213 Obesity complicating pregnancy, third trimester: Secondary | ICD-10-CM

## 2024-01-27 DIAGNOSIS — E669 Obesity, unspecified: Secondary | ICD-10-CM

## 2024-01-27 DIAGNOSIS — O09293 Supervision of pregnancy with other poor reproductive or obstetric history, third trimester: Secondary | ICD-10-CM | POA: Diagnosis not present

## 2024-01-27 DIAGNOSIS — Z3A29 29 weeks gestation of pregnancy: Secondary | ICD-10-CM | POA: Diagnosis not present

## 2024-02-04 ENCOUNTER — Ambulatory Visit: Payer: Medicaid Other | Admitting: Obstetrics and Gynecology

## 2024-02-04 ENCOUNTER — Encounter: Payer: Self-pay | Admitting: Student

## 2024-02-04 VITALS — BP 111/73 | HR 102 | Wt 201.0 lb

## 2024-02-04 DIAGNOSIS — Z3490 Encounter for supervision of normal pregnancy, unspecified, unspecified trimester: Secondary | ICD-10-CM

## 2024-02-04 MED ORDER — IRON 325 (65 FE) MG PO TABS
1.0000 | ORAL_TABLET | ORAL | 0 refills | Status: AC
Start: 2024-02-04 — End: ?

## 2024-02-04 MED ORDER — VITAMIN C IMMUNE HEALTH 500 MG PO CHEW
1.0000 | CHEWABLE_TABLET | Freq: Every day | ORAL | 1 refills | Status: AC
Start: 2024-02-04 — End: ?

## 2024-02-04 NOTE — Progress Notes (Signed)
   PRENATAL VISIT NOTE  Subjective:  Carrie Willis is a 28 y.o. [phone] at [redacted]w[redacted]d being seen today for ongoing prenatal care.  She is currently monitored for the following issues for this low-risk pregnancy and has Obstetric trauma, unspecified; IUFD at 20 weeks or more of gestation (32w); History of placenta abruption; Scoliosis; Postpartum hemorrhage; Supervision of normal pregnancy; Prediabetes; Carrier of spinal muscular atrophy; and Coloboma of retina on their problem list.  Patient reports no complaints.  Contractions: Irritability. Vag. Bleeding: None.  Movement: Present. Denies leaking of fluid.   The following portions of the patient's history were reviewed and updated as appropriate: allergies, current medications, past family history, past medical history, past social history, past surgical history and problem list.   Objective:   Vitals:   02/04/24 1450  BP: 111/73  Pulse: (!) 102  Weight: 201 lb (91.2 kg)    Fetal Status: Fetal Heart Rate (bpm): 155 (accel to 168) Fundal Height: 32 cm Movement: Present     General:  Alert, oriented and cooperative. Patient is in no acute distress.  Skin: Skin is warm and dry. No rash noted.   Cardiovascular: Normal heart rate noted  Respiratory: Normal respiratory effort, no problems with respiration noted  Abdomen: Soft, gravid, appropriate for gestational age.  Pain/Pressure: Present     Pelvic: Cervical exam deferred        Extremities: Normal range of motion.  Edema: None  Mental Status: Normal mood and affect. Normal behavior. Normal judgment and thought content.   Assessment and Plan:  Pregnancy: [id number] at [redacted]w[redacted]d  2 hour GTT normal at 28w BP good.  No complaints.  Feeling good overall.   Preterm labor symptoms and general obstetric precautions including but not limited to vaginal bleeding, contractions, leaking of fluid and fetal movement were reviewed in detail with the patient. Please refer to After Visit Summary for other  counseling recommendations.   No follow-ups on file.  Future Appointments  Date Time Provider Department Center  02/10/2024  9:30 AM [facility] US1 [facility]  02/18/2024  2:50 PM Michail Boyte, Harolyn Rutherford, NP [facility]  02/24/2024 10:45 AM [facility] NST [facility]  03/02/2024 11:30 AM [facility] US4 [facility]  03/03/2024  2:50 PM Terrilyn Tyner, Harolyn Rutherford, NP [facility]  03/17/2024  2:50 PM Dresean Beckel, Harolyn Rutherford, NP [facility]    Venia Carbon, NP

## 2024-02-07 ENCOUNTER — Ambulatory Visit: Payer: Medicaid Other | Attending: Obstetrics

## 2024-02-07 DIAGNOSIS — Z8759 Personal history of other complications of pregnancy, childbirth and the puerperium: Secondary | ICD-10-CM | POA: Insufficient documentation

## 2024-02-07 DIAGNOSIS — O09293 Supervision of pregnancy with other poor reproductive or obstetric history, third trimester: Secondary | ICD-10-CM | POA: Diagnosis not present

## 2024-02-07 DIAGNOSIS — Z3A31 31 weeks gestation of pregnancy: Secondary | ICD-10-CM | POA: Diagnosis not present

## 2024-02-07 DIAGNOSIS — E669 Obesity, unspecified: Secondary | ICD-10-CM

## 2024-02-07 DIAGNOSIS — O99213 Obesity complicating pregnancy, third trimester: Secondary | ICD-10-CM | POA: Diagnosis not present

## 2024-02-10 ENCOUNTER — Other Ambulatory Visit: Payer: Self-pay

## 2024-02-10 ENCOUNTER — Ambulatory Visit: Payer: Medicaid Other

## 2024-02-10 DIAGNOSIS — O99810 Abnormal glucose complicating pregnancy: Secondary | ICD-10-CM

## 2024-02-10 DIAGNOSIS — O364XX0 Maternal care for intrauterine death, not applicable or unspecified: Secondary | ICD-10-CM

## 2024-02-14 ENCOUNTER — Ambulatory Visit: Payer: Medicaid Other | Attending: Obstetrics and Gynecology | Admitting: *Deleted

## 2024-02-14 DIAGNOSIS — Z8759 Personal history of other complications of pregnancy, childbirth and the puerperium: Secondary | ICD-10-CM | POA: Diagnosis not present

## 2024-02-14 DIAGNOSIS — Z3689 Encounter for other specified antenatal screening: Secondary | ICD-10-CM | POA: Diagnosis present

## 2024-02-14 DIAGNOSIS — Z3A32 32 weeks gestation of pregnancy: Secondary | ICD-10-CM | POA: Insufficient documentation

## 2024-02-14 DIAGNOSIS — O09293 Supervision of pregnancy with other poor reproductive or obstetric history, third trimester: Secondary | ICD-10-CM

## 2024-02-14 DIAGNOSIS — O99213 Obesity complicating pregnancy, third trimester: Secondary | ICD-10-CM

## 2024-02-14 NOTE — Procedures (Signed)
 Carrie Willis 02-07-1996 [redacted]w[redacted]d  Fetus A Non-Stress Test Interpretation for 02/14/24  NST only  Indication:  hx IUFD  Fetal Heart Rate A Mode: External Baseline Rate (A): 140 bpm Variability: Moderate Accelerations: 15 x 15 Decelerations: Variable Multiple birth?: No  Uterine Activity Mode: Palpation, Toco Contraction Frequency (min): none Resting Tone Palpated: Relaxed  Interpretation (Fetal Testing) Nonstress Test Interpretation: Reactive Overall Impression: Reassuring for gestational age Comments: Dr. Parke Poisson reviewed tracing

## 2024-02-18 ENCOUNTER — Ambulatory Visit (INDEPENDENT_AMBULATORY_CARE_PROVIDER_SITE_OTHER): Payer: Medicaid Other | Admitting: Obstetrics and Gynecology

## 2024-02-18 VITALS — BP 109/74 | HR 97 | Wt 204.0 lb

## 2024-02-18 DIAGNOSIS — O364XX0 Maternal care for intrauterine death, not applicable or unspecified: Secondary | ICD-10-CM

## 2024-02-18 DIAGNOSIS — O99019 Anemia complicating pregnancy, unspecified trimester: Secondary | ICD-10-CM | POA: Diagnosis not present

## 2024-02-18 DIAGNOSIS — Z3490 Encounter for supervision of normal pregnancy, unspecified, unspecified trimester: Secondary | ICD-10-CM
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
## 2024-02-21 ENCOUNTER — Ambulatory Visit: Payer: Medicaid Other | Attending: Obstetrics and Gynecology | Admitting: *Deleted

## 2024-02-21 DIAGNOSIS — O09293 Supervision of pregnancy with other poor reproductive or obstetric history, third trimester: Secondary | ICD-10-CM

## 2024-02-21 DIAGNOSIS — Z8759 Personal history of other complications of pregnancy, childbirth and the puerperium: Secondary | ICD-10-CM

## 2024-02-21 DIAGNOSIS — Z3A33 33 weeks gestation of pregnancy: Secondary | ICD-10-CM

## 2024-02-21 DIAGNOSIS — O99213 Obesity complicating pregnancy, third trimester: Secondary | ICD-10-CM

## 2024-02-21 NOTE — Procedures (Addendum)
 Carrie Willis 03-22-1996 [redacted]w[redacted]d  Fetus A Non-Stress Test Interpretation for 02/21/24-NST with Limited  Indication:  his IUFD, Obese  Fetal Heart Rate A Mode: External Baseline Rate (A): 145 bpm Variability: Minimal, Moderate Accelerations: 10 x 10 Decelerations: None Multiple birth?: No  Uterine Activity Mode: Toco Contraction Frequency (min): ui Resting Tone Palpated: Relaxed  Interpretation (Fetal Testing) Nonstress Test Interpretation: Non-reactive Overall Impression: Reassuring for gestational age Comments: Tracing reviewed by Dr.Shankar. Limited performed at bedside  MFM Note  Modified BPP Patient is here for NST monitoring. NST is reactive.  I performed a bedside ultrasound. Amniotic fluid is normal and good fetal heart activity is seen. AFI is 10 cm. Cephalic presentation.  I reassured the patient of the findings. Reassuring Modified BPP is associated with a very low likelihood of stillbirth (0.8 per 1,000 births in one week).

## 2024-02-24 ENCOUNTER — Other Ambulatory Visit: Payer: Self-pay

## 2024-02-24 ENCOUNTER — Ambulatory Visit: Attending: Obstetrics and Gynecology | Admitting: *Deleted

## 2024-02-24 ENCOUNTER — Other Ambulatory Visit: Payer: Medicaid Other

## 2024-02-24 ENCOUNTER — Ambulatory Visit: Attending: Obstetrics | Admitting: Obstetrics

## 2024-02-24 VITALS — BP 117/64

## 2024-02-24 DIAGNOSIS — O99213 Obesity complicating pregnancy, third trimester: Secondary | ICD-10-CM

## 2024-02-24 DIAGNOSIS — O09293 Supervision of pregnancy with other poor reproductive or obstetric history, third trimester: Secondary | ICD-10-CM

## 2024-02-24 DIAGNOSIS — Z8759 Personal history of other complications of pregnancy, childbirth and the puerperium: Secondary | ICD-10-CM | POA: Insufficient documentation

## 2024-02-24 DIAGNOSIS — Z3A33 33 weeks gestation of pregnancy: Secondary | ICD-10-CM

## 2024-02-24 NOTE — Progress Notes (Signed)
 MFM Consult Note  Carrie Willis is currently at 33 weeks and 4 days.    She was seen for an NST and fluid check due to a history of a prior IUFD at around 33 weeks.    She denies any problems since her last exam.    She had a reactive NST today.    A limited ultrasound performed today showed a total AFI of 11.3 cm.    The fetus was in the vertex presentation.    Fetal movements were noted throughout today's ultrasound exam.    Due to her prior IUFD at 33+ weeks, as long as the fetal testing remains within normal limits, delivery should be considered at between 37 to 38 weeks.    The patient stated that all of her questions were answered today.    She will return in 1 week for a BPP and growth scan.  A total of 20 minutes was spent counseling and coordinating the care for this patient.

## 2024-02-24 NOTE — Procedures (Signed)
 Carrie Willis [birth year] [redacted]w[redacted]d  Fetus A Non-Stress Test Interpretation for 02/24/24  Indication:  hx IUFD  Fetal Heart Rate A Mode: External Baseline Rate (A): 140 bpm Variability: Moderate, Minimal Accelerations: 15 x 15 Decelerations: None Multiple birth?: No  Uterine Activity Mode: Palpation, Toco Contraction Frequency (min): none Resting Tone Palpated: Relaxed  Interpretation (Fetal Testing) Nonstress Test Interpretation: Reactive Overall Impression: Reassuring for gestational age Comments: Dr. Parke Poisson reviewed tracing

## 2024-02-28 ENCOUNTER — Ambulatory Visit: Payer: Medicaid Other | Attending: Obstetrics and Gynecology | Admitting: *Deleted

## 2024-02-28 VITALS — BP 111/64 | HR 104

## 2024-02-28 DIAGNOSIS — Z3A34 34 weeks gestation of pregnancy: Secondary | ICD-10-CM

## 2024-02-28 DIAGNOSIS — O09293 Supervision of pregnancy with other poor reproductive or obstetric history, third trimester: Secondary | ICD-10-CM | POA: Diagnosis not present

## 2024-02-28 DIAGNOSIS — Z148 Genetic carrier of other disease: Secondary | ICD-10-CM

## 2024-02-28 DIAGNOSIS — Z8759 Personal history of other complications of pregnancy, childbirth and the puerperium: Secondary | ICD-10-CM

## 2024-02-28 NOTE — Procedures (Addendum)
 Carrie Willis [birth year] [redacted]w[redacted]d  Fetus A Non-Stress Test Interpretation for 02/28/24  Indication:  his IUFD  Fetal Heart Rate A Mode: External Baseline Rate (A): 150 bpm Variability: Moderate Accelerations: 15 x 15 Decelerations: None  Uterine Activity Mode: Toco Contraction Frequency (min): none Resting Tone Palpated: Relaxed  Interpretation (Fetal Testing) Nonstress Test Interpretation: Reactive Comments: tracing reviewed byDr. Judeth Cornfield  MFM Note Modified BPP Patient is here for NST monitoring. NST is reactive. I performed a bedside ultrasound. Amniotic fluid is normal and good fetal heart activity is seen. AFI is 10 cm. Cephalic presentation. I reassured the patient of the findings. Reassuring Modified BPP is associated with a very low likelihood of stillbirth (0.8 per 1,000 births in one week).

## 2024-03-02 ENCOUNTER — Ambulatory Visit: Payer: Medicaid Other

## 2024-03-03 ENCOUNTER — Ambulatory Visit (INDEPENDENT_AMBULATORY_CARE_PROVIDER_SITE_OTHER): Payer: Medicaid Other | Admitting: Obstetrics and Gynecology

## 2024-03-03 VITALS — BP 118/75 | HR 109 | Wt 205.0 lb

## 2024-03-03 DIAGNOSIS — Z3490 Encounter for supervision of normal pregnancy, unspecified, unspecified trimester: Secondary | ICD-10-CM

## 2024-03-03 DIAGNOSIS — O364XX0 Maternal care for intrauterine death, not applicable or unspecified: Secondary | ICD-10-CM | POA: Diagnosis not present

## 2024-03-03 NOTE — Progress Notes (Signed)
   PRENATAL VISIT NOTE  Subjective:  Carrie Willis is a 28 y.o. [phone] at [redacted]w[redacted]d being seen today for ongoing prenatal care.  She is currently monitored for the following issues for this high-risk pregnancy and has Obstetric trauma, unspecified; IUFD at 20 weeks or more of gestation (32w); History of placenta abruption; Scoliosis; Postpartum hemorrhage; Supervision of normal pregnancy; Prediabetes; Carrier of spinal muscular atrophy; and Coloboma of retina on their problem list.  Patient reports no complaints.  Contractions: Irritability. Vag. Bleeding: None.  Movement: Present. Denies leaking of fluid.   The following portions of the patient's history were reviewed and updated as appropriate: allergies, current medications, past family history, past medical history, past social history, past surgical history and problem list.   Objective:   Vitals:   03/03/24 1440  BP: 118/75  Pulse: (!) 109  Weight: 205 lb (93 kg)    Fetal Status: Fetal Heart Rate (bpm): 154   Movement: Present     General:  Alert, oriented and cooperative. Patient is in no acute distress.  Skin: Skin is warm and dry. No rash noted.   Cardiovascular: Normal heart rate noted  Respiratory: Normal respiratory effort, no problems with respiration noted  Abdomen: Soft, gravid, appropriate for gestational age.  Pain/Pressure: Present     Pelvic: Cervical exam deferred        Extremities: Normal range of motion.  Edema: Trace  Mental Status: Normal mood and affect. Normal behavior. Normal judgment and thought content.   Assessment and Plan:  Pregnancy: [id number] at [redacted]w[redacted]d  1. Encounter for supervision of normal pregnancy, antepartum, unspecified gravidity (Primary)  - Scheduled for induction at 37w- orders placed - Feeling vigorous movement - BP normal - Keep MFM weekly visits for antenatal testing growth [place] q 4 weeks. - Continue BASA - CBC; Standing - RPR; Standing - Type and screen; Standing  2. IUFD at 20  weeks or more of gestation (32w)  - CBC; Standing - RPR; Standing - Type and screen; Standing     Preterm labor symptoms and general obstetric precautions including but not limited to vaginal bleeding, contractions, leaking of fluid and fetal movement were reviewed in detail with the patient. Please refer to After Visit Summary for other counseling recommendations.   No follow-ups on file.  Future Appointments  Date Time Provider Department Center  03/03/2024  2:50 PM Kyndall Chaplin, Harolyn Rutherford, NP [facility]  03/06/2024  3:30 PM [facility] US1 [facility]  03/16/2024  3:30 PM [facility] US1 [facility]  03/17/2024  2:50 PM Garima Chronis, Harolyn Rutherford, NP [facility]  03/31/2024  2:50 PM Kateri Balch, Harolyn Rutherford, NP [facility]    Venia Carbon, NP

## 2024-03-03 NOTE — Progress Notes (Signed)
error 

## 2024-03-05 ENCOUNTER — Encounter (HOSPITAL_COMMUNITY): Payer: Self-pay | Admitting: *Deleted

## 2024-03-05 ENCOUNTER — Telehealth (HOSPITAL_COMMUNITY): Payer: Self-pay | Admitting: *Deleted

## 2024-03-05 NOTE — Telephone Encounter (Signed)
 Preadmission screen

## 2024-03-06 ENCOUNTER — Ambulatory Visit: Payer: Medicaid Other | Attending: Obstetrics

## 2024-03-06 DIAGNOSIS — Z148 Genetic carrier of other disease: Secondary | ICD-10-CM

## 2024-03-06 DIAGNOSIS — E669 Obesity, unspecified: Secondary | ICD-10-CM

## 2024-03-06 DIAGNOSIS — Z3A35 35 weeks gestation of pregnancy: Secondary | ICD-10-CM

## 2024-03-06 DIAGNOSIS — Z8759 Personal history of other complications of pregnancy, childbirth and the puerperium: Secondary | ICD-10-CM | POA: Diagnosis present

## 2024-03-06 DIAGNOSIS — O09293 Supervision of pregnancy with other poor reproductive or obstetric history, third trimester: Secondary | ICD-10-CM

## 2024-03-06 DIAGNOSIS — O99213 Obesity complicating pregnancy, third trimester: Secondary | ICD-10-CM

## 2024-03-16 ENCOUNTER — Ambulatory Visit: Attending: Obstetrics and Gynecology

## 2024-03-16 ENCOUNTER — Ambulatory Visit (HOSPITAL_BASED_OUTPATIENT_CLINIC_OR_DEPARTMENT_OTHER): Admitting: Maternal & Fetal Medicine

## 2024-03-16 ENCOUNTER — Other Ambulatory Visit: Payer: Self-pay

## 2024-03-16 DIAGNOSIS — O09293 Supervision of pregnancy with other poor reproductive or obstetric history, third trimester: Secondary | ICD-10-CM | POA: Diagnosis present

## 2024-03-16 DIAGNOSIS — Z3A36 36 weeks gestation of pregnancy: Secondary | ICD-10-CM | POA: Insufficient documentation

## 2024-03-16 DIAGNOSIS — O364XX0 Maternal care for intrauterine death, not applicable or unspecified: Secondary | ICD-10-CM

## 2024-03-16 DIAGNOSIS — Z8759 Personal history of other complications of pregnancy, childbirth and the puerperium: Secondary | ICD-10-CM | POA: Diagnosis not present

## 2024-03-16 DIAGNOSIS — O99213 Obesity complicating pregnancy, third trimester: Secondary | ICD-10-CM

## 2024-03-16 DIAGNOSIS — E669 Obesity, unspecified: Secondary | ICD-10-CM | POA: Diagnosis not present
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
## 2024-03-17 ENCOUNTER — Other Ambulatory Visit (HOSPITAL_COMMUNITY)
Admission: RE | Admit: 2024-03-17 | Discharge: 2024-03-17 | Disposition: A | Discharge status: Home / Self Care | Source: Ambulatory Visit | Attending: Obstetrics and Gynecology | Admitting: Obstetrics and Gynecology

## 2024-03-17 ENCOUNTER — Ambulatory Visit (INDEPENDENT_AMBULATORY_CARE_PROVIDER_SITE_OTHER): Payer: Medicaid Other | Admitting: Obstetrics and Gynecology

## 2024-03-17 VITALS — BP 109/71 | HR 103 | Wt 206.0 lb

## 2024-03-17 DIAGNOSIS — Z3490 Encounter for supervision of normal pregnancy, unspecified, unspecified trimester: Secondary | ICD-10-CM | POA: Diagnosis present

## 2024-03-17 NOTE — Progress Notes (Signed)
   PRENATAL VISIT NOTE  Subjective:  Carrie Willis is a 28 y.o. [phone] at [redacted]w[redacted]d being seen today for ongoing prenatal care.  She is currently monitored for the following issues for this high-risk pregnancy and has Obstetric trauma, unspecified; IUFD at 20 weeks or more of gestation (32w); History of placenta abruption; Scoliosis; Postpartum hemorrhage; Supervision of normal pregnancy; Prediabetes; Carrier of spinal muscular atrophy; and Coloboma of retina on their problem list.  Patient reports no complaints.  Contractions: Not present. Vag. Bleeding: None.  Movement: Present. Denies leaking of fluid.   The following portions of the patient's history were reviewed and updated as appropriate: allergies, current medications, past family history, past medical history, past social history, past surgical history and problem list.   Objective:   Vitals:   03/17/24 1442  BP: 109/71  Pulse: (!) 103  Weight: 206 lb (93.4 kg)    Fetal Status:   Fundal Height: 36 cm Movement: Present     General:  Alert, oriented and cooperative. Patient is in no acute distress.  Skin: Skin is warm and dry. No rash noted.   Cardiovascular: Normal heart rate noted  Respiratory: Normal respiratory effort, no problems with respiration noted  Abdomen: Soft, gravid, appropriate for gestational age.  Pain/Pressure: Present     Pelvic: Cervix 1 cm, posterior, thick.   Extremities: Normal range of motion.  Edema: Trace  Mental Status: Normal mood and affect. Normal behavior. Normal judgment and thought content.   Assessment and Plan:  Pregnancy: [id number] at [redacted]w[redacted]d 1. Encounter for supervision of normal pregnancy, antepartum, unspecified gravidity (Primary)  -BPP 8/8 yesterday with MFM  -Vigorous movement.  -Induction scheduled for 4/11 -Culture, beta strep (group b only) -Cervicovaginal ancillary only( [place])  Preterm labor symptoms and general obstetric precautions including but not limited to vaginal  bleeding, contractions, leaking of fluid and fetal movement were reviewed in detail with the patient. Please refer to After Visit Summary for other counseling recommendations.   No follow-ups on file.  Future Appointments  Date Time Provider Department Center  03/20/2024  6:45 AM [facility] SCHED ROOM [facility] None  04/21/2024  8:30 AM Briley Bumgarner, Harolyn Rutherford, NP [facility]    Venia Carbon, NP

## 2024-03-18 LAB — CERVICOVAGINAL ANCILLARY ONLY
Chlamydia: NEGATIVE
Comment: NEGATIVE
Comment: NORMAL
Neisseria Gonorrhea: NEGATIVE

## 2024-03-20 ENCOUNTER — Inpatient Hospital Stay (HOSPITAL_COMMUNITY): Admitting: Anesthesiology

## 2024-03-20 ENCOUNTER — Other Ambulatory Visit: Payer: Self-pay

## 2024-03-20 ENCOUNTER — Inpatient Hospital Stay (HOSPITAL_COMMUNITY)
Admission: RE | Admit: 2024-03-20 | Discharge: 2024-03-22 | DRG: 806 | Disposition: A | Discharge status: Home / Self Care | Attending: Obstetrics and Gynecology | Admitting: Obstetrics and Gynecology

## 2024-03-20 ENCOUNTER — Other Ambulatory Visit (HOSPITAL_COMMUNITY): Payer: Self-pay | Admitting: Advanced Practice Midwife

## 2024-03-20 ENCOUNTER — Encounter (HOSPITAL_COMMUNITY): Payer: Self-pay | Admitting: Family Medicine

## 2024-03-20 ENCOUNTER — Inpatient Hospital Stay (HOSPITAL_COMMUNITY)

## 2024-03-20 DIAGNOSIS — Z8759 Personal history of other complications of pregnancy, childbirth and the puerperium: Principal | ICD-10-CM

## 2024-03-20 DIAGNOSIS — Z3A37 37 weeks gestation of pregnancy: Secondary | ICD-10-CM

## 2024-03-20 DIAGNOSIS — Z8249 Family history of ischemic heart disease and other diseases of the circulatory system: Secondary | ICD-10-CM | POA: Diagnosis not present

## 2024-03-20 DIAGNOSIS — Z833 Family history of diabetes mellitus: Secondary | ICD-10-CM | POA: Diagnosis not present

## 2024-03-20 DIAGNOSIS — O26893 Other specified pregnancy related conditions, third trimester: Secondary | ICD-10-CM | POA: Diagnosis present

## 2024-03-20 DIAGNOSIS — Z148 Genetic carrier of other disease: Secondary | ICD-10-CM | POA: Diagnosis not present

## 2024-03-20 DIAGNOSIS — O9902 Anemia complicating childbirth: Secondary | ICD-10-CM | POA: Diagnosis present

## 2024-03-20 DIAGNOSIS — Z3490 Encounter for supervision of normal pregnancy, unspecified, unspecified trimester: Secondary | ICD-10-CM

## 2024-03-20 DIAGNOSIS — O364XX0 Maternal care for intrauterine death, not applicable or unspecified: Secondary | ICD-10-CM

## 2024-03-20 DIAGNOSIS — O09293 Supervision of pregnancy with other poor reproductive or obstetric history, third trimester: Secondary | ICD-10-CM | POA: Diagnosis not present

## 2024-03-20 LAB — CBC
HCT: 34.9 % — ABNORMAL LOW (ref 36.0–46.0)
Hemoglobin: 10.4 g/dL — ABNORMAL LOW (ref 12.0–15.0)
MCH: 22.1 pg — ABNORMAL LOW (ref 26.0–34.0)
MCHC: 29.8 g/dL — ABNORMAL LOW (ref 30.0–36.0)
MCV: 74.3 fL — ABNORMAL LOW (ref 80.0–100.0)
Platelets: 280 10*3/uL (ref 150–400)
RBC: 4.7 MIL/uL (ref 3.87–5.11)
RDW: 17.5 % — ABNORMAL HIGH (ref 11.5–15.5)
WBC: 9.7 10*3/uL (ref 4.0–10.5)
nRBC: 0 % (ref 0.0–0.2)

## 2024-03-20 LAB — GROUP B STREP BY PCR: Group B strep by PCR: NEGATIVE

## 2024-03-20 LAB — RPR: RPR Ser Ql: NONREACTIVE

## 2024-03-20 LAB — TYPE AND SCREEN
ABO/RH(D): O POS
Antibody Screen: NEGATIVE

## 2024-03-20 MED ORDER — SOD CITRATE-CITRIC ACID 500-334 MG/5ML PO SOLN: 30.0000 mL | ORAL | Status: DC | PRN

## 2024-03-20 MED ORDER — PRENATAL MULTIVITAMIN CH
1.0000 | ORAL_TABLET | Freq: Every day | ORAL | Status: DC
  Administered 2024-03-21 – 2024-03-22 (×2): 1 via ORAL
  Filled 2024-03-20 (×2): qty 1

## 2024-03-20 MED ORDER — ONDANSETRON HCL 4 MG/2ML IJ SOLN: 4.0000 mg | Freq: Four times a day (QID) | INTRAMUSCULAR | Status: DC | PRN

## 2024-03-20 MED ORDER — ACETAMINOPHEN 325 MG PO TABS: 650.0000 mg | ORAL_TABLET | ORAL | Status: DC | PRN

## 2024-03-20 MED ORDER — MEASLES, MUMPS & RUBELLA VAC IJ SOLR: 0.5000 mL | Freq: Once | INTRAMUSCULAR | Status: DC

## 2024-03-20 MED ORDER — EPHEDRINE 5 MG/ML INJ
10.0000 mg | INTRAVENOUS | Status: DC | PRN
  Filled 2024-03-20: qty 5

## 2024-03-20 MED ORDER — LACTATED RINGERS IV SOLN: INTRAVENOUS | Status: DC

## 2024-03-20 MED ORDER — OXYTOCIN BOLUS FROM INFUSION: 333.0000 mL | Freq: Once | INTRAVENOUS | Status: AC

## 2024-03-20 MED ORDER — DIPHENHYDRAMINE HCL 25 MG PO CAPS: 25.0000 mg | ORAL_CAPSULE | Freq: Four times a day (QID) | ORAL | Status: DC | PRN

## 2024-03-20 MED ORDER — COCONUT OIL OIL: 1.0000 | TOPICAL_OIL | Status: DC | PRN

## 2024-03-20 MED ORDER — PHENYLEPHRINE 80 MCG/ML (10ML) SYRINGE FOR IV PUSH (FOR BLOOD PRESSURE SUPPORT)
80.0000 ug | PREFILLED_SYRINGE | INTRAVENOUS | Status: AC | PRN
  Administered 2024-03-20 (×3): 80 ug via INTRAVENOUS

## 2024-03-20 MED ORDER — ONDANSETRON HCL 4 MG/2ML IJ SOLN: 4.0000 mg | INTRAMUSCULAR | Status: DC | PRN

## 2024-03-20 MED ORDER — ZOLPIDEM TARTRATE 5 MG PO TABS: 5.0000 mg | ORAL_TABLET | Freq: Every evening | ORAL | Status: DC | PRN

## 2024-03-20 MED ORDER — OXYCODONE HCL 5 MG PO TABS: 5.0000 mg | ORAL_TABLET | ORAL | Status: DC | PRN

## 2024-03-20 MED ORDER — OXYCODONE-ACETAMINOPHEN 5-325 MG PO TABS: 1.0000 | ORAL_TABLET | ORAL | Status: DC | PRN

## 2024-03-20 MED ORDER — DIPHENHYDRAMINE HCL 50 MG/ML IJ SOLN: 12.5000 mg | INTRAMUSCULAR | Status: DC | PRN

## 2024-03-20 MED ORDER — ONDANSETRON HCL 4 MG PO TABS: 4.0000 mg | ORAL_TABLET | ORAL | Status: DC | PRN

## 2024-03-20 MED ORDER — SIMETHICONE 80 MG PO CHEW: 80.0000 mg | CHEWABLE_TABLET | ORAL | Status: DC | PRN

## 2024-03-20 MED ORDER — EPHEDRINE 5 MG/ML INJ: 10.0000 mg | INTRAVENOUS | Status: DC | PRN

## 2024-03-20 MED ORDER — SENNOSIDES-DOCUSATE SODIUM 8.6-50 MG PO TABS
2.0000 | ORAL_TABLET | ORAL | Status: DC
  Administered 2024-03-20 – 2024-03-21 (×2): 2 via ORAL
  Filled 2024-03-20 (×2): qty 2

## 2024-03-20 MED ORDER — OXYTOCIN-SODIUM CHLORIDE 30-0.9 UT/500ML-% IV SOLN
INTRAVENOUS | Status: AC
  Administered 2024-03-20: 333 mL via INTRAVENOUS
  Filled 2024-03-20: qty 500

## 2024-03-20 MED ORDER — DIBUCAINE (PERIANAL) 1 % EX OINT: 1.0000 | TOPICAL_OINTMENT | CUTANEOUS | Status: DC | PRN

## 2024-03-20 MED ORDER — BUPIVACAINE HCL (PF) 0.25 % IJ SOLN
INTRAMUSCULAR | Status: DC | PRN
  Administered 2024-03-20: 1.3 mL via INTRATHECAL

## 2024-03-20 MED ORDER — LACTATED RINGERS IV SOLN
500.0000 mL | Freq: Once | INTRAVENOUS | Status: AC
  Administered 2024-03-20: 500 mL via INTRAVENOUS

## 2024-03-20 MED ORDER — OXYCODONE-ACETAMINOPHEN 5-325 MG PO TABS: 2.0000 | ORAL_TABLET | ORAL | Status: DC | PRN

## 2024-03-20 MED ORDER — TRANEXAMIC ACID-NACL 1000-0.7 MG/100ML-% IV SOLN
1000.0000 mg | Freq: Once | INTRAVENOUS | Status: AC
Start: 2024-03-20 — End: 2024-03-20
  Administered 2024-03-20: 1000 mg via INTRAVENOUS
  Filled 2024-03-20: qty 100

## 2024-03-20 MED ORDER — LIDOCAINE HCL (PF) 1 % IJ SOLN: 30.0000 mL | INTRAMUSCULAR | Status: DC | PRN

## 2024-03-20 MED ORDER — TERBUTALINE SULFATE 1 MG/ML IJ SOLN: 0.2500 mg | Freq: Once | INTRAMUSCULAR | Status: DC | PRN

## 2024-03-20 MED ORDER — MISOPROSTOL 25 MCG QUARTER TABLET
25.0000 ug | ORAL_TABLET | Freq: Once | ORAL | Status: AC
  Administered 2024-03-20: 25 ug via VAGINAL
  Filled 2024-03-20: qty 1

## 2024-03-20 MED ORDER — FENTANYL-BUPIVACAINE-NACL 0.5-0.125-0.9 MG/250ML-% EP SOLN
12.0000 mL/h | EPIDURAL | Status: DC | PRN
  Filled 2024-03-20: qty 250

## 2024-03-20 MED ORDER — OXYTOCIN-SODIUM CHLORIDE 30-0.9 UT/500ML-% IV SOLN
2.5000 [IU]/h | INTRAVENOUS | Status: DC
  Administered 2024-03-20: 2.5 [IU]/h via INTRAVENOUS

## 2024-03-20 MED ORDER — IBUPROFEN 600 MG PO TABS
600.0000 mg | ORAL_TABLET | Freq: Four times a day (QID) | ORAL | Status: DC
  Administered 2024-03-20 – 2024-03-22 (×8): 600 mg via ORAL
  Filled 2024-03-20 (×8): qty 1

## 2024-03-20 MED ORDER — BENZOCAINE-MENTHOL 20-0.5 % EX AERO
1.0000 | INHALATION_SPRAY | CUTANEOUS | Status: DC | PRN
  Administered 2024-03-20: 1 via TOPICAL
  Filled 2024-03-20: qty 56

## 2024-03-20 MED ORDER — FENTANYL-BUPIVACAINE-NACL 0.5-0.125-0.9 MG/250ML-% EP SOLN
EPIDURAL | Status: DC | PRN
  Administered 2024-03-20: 12 mL/h via EPIDURAL

## 2024-03-20 MED ORDER — WITCH HAZEL-GLYCERIN EX PADS: 1.0000 | MEDICATED_PAD | CUTANEOUS | Status: DC | PRN

## 2024-03-20 MED ORDER — MISOPROSTOL 50MCG HALF TABLET
50.0000 ug | ORAL_TABLET | ORAL | Status: DC
  Administered 2024-03-20 (×2): 50 ug via ORAL
  Filled 2024-03-20 (×2): qty 1

## 2024-03-20 MED ORDER — TETANUS-DIPHTH-ACELL PERTUSSIS 5-2.5-18.5 LF-MCG/0.5 IM SUSY: 0.5000 mL | PREFILLED_SYRINGE | Freq: Once | INTRAMUSCULAR | Status: DC

## 2024-03-20 MED ORDER — PHENYLEPHRINE 80 MCG/ML (10ML) SYRINGE FOR IV PUSH (FOR BLOOD PRESSURE SUPPORT)
80.0000 ug | PREFILLED_SYRINGE | INTRAVENOUS | Status: DC | PRN
  Filled 2024-03-20: qty 10
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
## 2024-03-20 NOTE — Lactation Note (Signed)
 This note was copied from a baby's chart. Lactation Consultation Note  Patient Name: Carrie Willis [id number] Date: 03/20/2024 Age:30 hours  Mom didn't want Lactation on admission and declines Lactation services at this time. RN told mom if she changes her mind to call for Lactation.   Maternal Data    Feeding    LATCH Score                    Lactation Tools Discussed/Used    Interventions    Discharge    Consult Status Consult Status: Complete    [name] G 03/20/2024, 11:01 PM
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
## 2024-03-20 NOTE — Progress Notes (Signed)
 Patient ID: Carrie Willis, female   DOB: [birth year], 28 y.o.   MRN: [id number]  CTSP for FHR decel post epidural/spinal placement; reached nadir of 60s down from baseline of 150s; phenyl in the process of being given x 3 doses as well as fluids and position changes; BP 103/51; FHR stable at 130s now.   Her IOL course has been significant for cytotec x dual dose and one oral dose, and then with SROM at 0957 with cervix 4-5/60/vtx -2 at that time, prior to epidural. Will continue to monitor FHR and consult anesthesia prn re BP management.  Carrie Willis 03/20/2024
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
## 2024-03-20 NOTE — Anesthesia Preprocedure Evaluation (Addendum)
 Anesthesia Evaluation  Patient identified by MRN, date of birth, ID band Patient awake    Reviewed: Allergy & Precautions, Patient's Chart, lab work & pertinent test results  Airway Mallampati: II  TM Distance: >3 FB Neck ROM: Full    Dental no notable dental hx.    Pulmonary neg pulmonary ROS   Pulmonary exam normal breath sounds clear to auscultation       Cardiovascular negative cardio ROS Normal cardiovascular exam Rhythm:Regular Rate:Normal     Neuro/Psych negative neurological ROS  negative psych ROS   GI/Hepatic negative GI ROS, Neg liver ROS,,,  Endo/Other  BMI 36  Renal/GU negative Renal ROS  negative genitourinary   Musculoskeletal negative musculoskeletal ROS (+)    Abdominal  (+) + obese  Peds negative pediatric ROS (+)  Hematology  (+) Blood dyscrasia, anemia Hb 10.4, plt 280   Anesthesia Other Findings   Reproductive/Obstetrics (+) Pregnancy                             Anesthesia Physical Anesthesia Plan  ASA: 2  Anesthesia Plan: Combined Spinal and Epidural   Post-op Pain Management:    Induction:   PONV Risk Score and Plan: 2  Airway Management Planned: Natural Airway  Additional Equipment: None  Intra-op Plan:   Post-operative Plan:   Informed Consent: I have reviewed the patients History and Physical, chart, labs and discussed the procedure including the risks, benefits and alternatives for the proposed anesthesia with the patient or authorized representative who has indicated his/her understanding and acceptance.       Plan Discussed with:   Anesthesia Plan Comments: (Per pt, never able to get comfortable w/ other epidurals. Upon chart review, I suspect this is d/t rapid progression of labors. Will place CSE this time.)       Anesthesia Quick Evaluation
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
## 2024-03-21 NOTE — Anesthesia Postprocedure Evaluation (Signed)
 Anesthesia Post Note  Patient: TAKOYA JONAS  Procedure(s) Performed: AN AD HOC LABOR EPIDURAL     Patient location during evaluation: Mother Baby Anesthesia Type: Epidural Level of consciousness: awake and alert Pain management: pain level controlled Vital Signs Assessment: post-procedure vital signs reviewed and stable Respiratory status: spontaneous breathing, nonlabored ventilation and respiratory function stable Cardiovascular status: stable Postop Assessment: no headache, no backache, epidural receding and able to ambulate Anesthetic complications: no   No notable events documented.  Last Vitals:  Vitals:   03/20/24 2340 03/21/24 0510  BP: 96/62 103/65  Pulse: 72   Resp: 18 16  Temp: 36.8 C 36.8 C  SpO2: 100% 99%    Last Pain:  Vitals:   03/21/24 0510  TempSrc: Oral  PainSc:    Pain Goal:                   Geneal Huebert

## 2024-03-21 NOTE — Progress Notes (Addendum)
 POSTPARTUM PROGRESS NOTE  Subjective: Carrie Willis is a 28 y.o. [id number] s/p SVD at [redacted]w[redacted]d.  She reports she doing well. No acute events overnight. She denies any problems with ambulating, voiding or po intake. Denies nausea or vomiting. She has passed flatus. Pain is well controlled.  Lochia is appropriate.  Objective: Blood pressure 103/65, pulse 72, temperature 98.3 F (36.8 C), temperature source Oral, resp. rate 16, height 5\' 3"  (1.6 m), weight 93.4 kg, last menstrual period 07/04/2023, SpO2 99%, unknown if currently breastfeeding.  Physical Exam:  General: alert, cooperative and no distress Chest: no respiratory distress Abdomen: soft, non-tender  Uterine Fundus: firm and at level of umbilicus Extremities: No calf swelling or tenderness  no edema  Recent Labs    03/20/24 0307  HGB 10.4*  HCT 34.9*    Assessment/Plan: Carrie Willis is a 28 y.o. [id number] s/p SVD at [redacted]w[redacted]d for hx of IUFD.  Routine Postpartum Care: Doing well, pain well-controlled.  -- Continue routine care, lactation support  -- Contraception: none -- Feeding: breast  Dispo: Plan for discharge tomorrow.  Carrie Calandra, DO Faculty Practice, Center for [facility] 03/21/2024 7:14 AM   GME ATTESTATION:  Evaluation and management procedures were performed by the [facility] Medicine Resident under my supervision. I was immediately available for direct supervision, assistance and direction throughout this encounter.  I also confirm that I have verified the information documented in the resident's note, and that I have also personally reperformed the pertinent components of the physical exam and all of the medical decision making activities.  I have also made any necessary editorial changes.  Carrie End, MD OB Fellow, Faculty Practice [facility], Center for [facility] 03/21/2024 4:53 PM

## 2024-03-22 MED ORDER — IBUPROFEN 600 MG PO TABS
600.0000 mg | ORAL_TABLET | Freq: Four times a day (QID) | ORAL | 0 refills | Status: AC
Start: 2024-03-22 — End: ?

## 2024-03-30 ENCOUNTER — Telehealth (HOSPITAL_COMMUNITY): Payer: Self-pay | Admitting: *Deleted

## 2024-03-30 NOTE — Telephone Encounter (Signed)
 03/30/2024  Name: Carrie Willis MRN: [id number] DOB: 1996/02/11  Reason for Call:  Transition of Care Hospital Discharge Call  Contact Status: Patient Contact Status: Complete  Language assistant needed: Interpreter Mode: Interpreter Not Needed        Follow-Up Questions: Do You Have Any Concerns About Your Health As You Heal From Delivery?: No Do You Have Any Concerns About Your Infants Health?: No  Edinburgh Postnatal Depression Scale:  In the Past 7 Days: I have been able to laugh and see the funny side of things.: As much as I always could I have looked forward with enjoyment to things.: As much as I ever did I have blamed myself unnecessarily when things went wrong.: Not very often I have been anxious or worried for no good reason.: Hardly ever I have felt scared or panicky for no good reason.: No, not at all Things have been getting on top of me.: No, I have been coping as well as ever I have been so unhappy that I have had difficulty sleeping.: Not at all I have felt sad or miserable.: No, not at all I have been so unhappy that I have been crying.: No, never The thought of harming myself has occurred to me.: Never Edinburgh Postnatal Depression Scale Total: 2  PHQ2-9 Depression Scale:     Discharge Follow-up: Edinburgh score requires follow up?: No Patient was advised of the following resources:: Support Group, Breastfeeding Support Group  Post-discharge interventions: Reviewed Newborn Safe Sleep Practices  Pearlie Bougie, RN 03/30/2024 16:44

## 2024-03-31 ENCOUNTER — Encounter: Payer: Medicaid Other | Admitting: Obstetrics and Gynecology

## 2024-04-21 ENCOUNTER — Other Ambulatory Visit (HOSPITAL_COMMUNITY)
Admission: RE | Admit: 2024-04-21 | Discharge: 2024-04-21 | Disposition: A | Discharge status: Home / Self Care | Source: Ambulatory Visit | Attending: Obstetrics and Gynecology | Admitting: Obstetrics and Gynecology

## 2024-04-21 ENCOUNTER — Ambulatory Visit: Admitting: Obstetrics and Gynecology

## 2024-04-21 VITALS — BP 110/79 | HR 76 | Ht 63.0 in | Wt 200.0 lb

## 2024-04-21 DIAGNOSIS — Z01419 Encounter for gynecological examination (general) (routine) without abnormal findings: Secondary | ICD-10-CM | POA: Insufficient documentation

## 2024-04-21 NOTE — Progress Notes (Unsigned)
 Post Partum Visit Note  Carrie Willis is a 28 y.o. [phone] female who presents for a postpartum visit. She is 4 week postpartum following a normal spontaneous vaginal delivery.  I have fully reviewed the prenatal and intrapartum course. The delivery was at 37w gestational weeks.  Anesthesia: epidural. Postpartum course has been good. Baby is doing well. Baby is feeding by both breast and bottle - neosure. Bleeding no bleeding. Bowel function is normal. Bladder function is normal. Patient is not sexually active. Contraception method is undecided. Postpartum depression screening: negative.  The pregnancy intention screening data noted above was reviewed. Potential methods of contraception were discussed. The patient elected to proceed with No data recorded.   Edinburgh Postnatal Depression Scale - 04/21/24 0841       Edinburgh Postnatal Depression Scale:  In the Past 7 Days   I have been able to laugh and see the funny side of things. 0    I have looked forward with enjoyment to things. 0    I have blamed myself unnecessarily when things went wrong. 0    I have been anxious or worried for no good reason. 0    I have felt scared or panicky for no good reason. 0    Things have been getting on top of me. 0    I have been so unhappy that I have had difficulty sleeping. 0    I have felt sad or miserable. 0    I have been so unhappy that I have been crying. 0    The thought of harming myself has occurred to me. 0    Edinburgh Postnatal Depression Scale Total 0             Health Maintenance Due  Topic Date Due   COVID-19 Vaccine (1) Never done   Cervical Cancer Screening (Pap smear)  04/27/2024    {Common ambulatory SmartLinks:19316}  Review of Systems {ros; complete:30496}  Objective:  BP 110/79   Pulse 76   Ht 5\' 3"  (1.6 m)   Wt 200 lb (90.7 kg)   LMP 07/04/2023   Breastfeeding Yes   BMI 35.43 kg/m    General:  {gen appearance:16600}   Breasts:  {desc;  normal/abnormal/not indicated:14647}  Lungs: {lung exam:16931}  Heart:  {heart exam:5510}  Abdomen: {abdomen exam:16834}   Wound {Wound assessment:11097}  GU exam:  {desc; normal/abnormal/not indicated:14647}       Assessment:   Normal postpartum exam.  Pap due today.   Plan:   Essential components of care per ACOG recommendations:  1.  Mood and well being: Patient with {gen negative/positive:315881} depression screening today. Reviewed local resources for support.  - Patient tobacco use? {tobacco use:25506}  - hx of drug use? {yes/no:25505}    2. Infant care and feeding:  -Patient currently breastmilk feeding? {yes/no:25502}  -Social determinants of health (SDOH) reviewed in EPIC. No concerns***The following needs were identified***  3. Sexuality, contraception and birth spacing - Patient {DOES_DOES WJX:91478} want a pregnancy in the next year.  Desired family size is {NUMBER 1-10:22536} children.  - Reviewed reproductive life planning. Reviewed contraceptive methods based on pt preferences and effectiveness.  Patient desired {Upstream End Methods:24109} today.   - Discussed birth spacing of 18 months  4. Sleep and fatigue -Encouraged family/partner/community support of 4 hrs of uninterrupted sleep to help with mood and fatigue  5. Physical Recovery  - Discussed patients delivery and complications. She describes her labor as {description:25511} - Patient had a {CHL  AMB DELIVERY:936 194 3306}. Patient had a {laceration:25518} laceration. Perineal healing reviewed. Patient expressed understanding - Patient has urinary incontinence? {yes/no:25515} - Patient {ACTION; IS/IS WUJ:81191478} safe to resume physical and sexual activity  6.  Health Maintenance - HM due items addressed {Yes or If no, why not?:20788} - Last pap smear No results found for: "DIAGPAP" Pap smear {done:10129} at today's visit.  -Breast Cancer screening indicated? {indicated:25516}  7. Chronic  Disease/Pregnancy Condition follow up: {Follow up:25499}  - PCP follow up  Almond Jaffe, NP Center for [facility], [facility]

## 2024-04-24 ENCOUNTER — Ambulatory Visit: Payer: Self-pay | Admitting: Obstetrics and Gynecology

## 2024-04-24 LAB — CYTOLOGY - PAP
Chlamydia: NEGATIVE
Comment: NEGATIVE
Comment: NEGATIVE
Comment: NORMAL
Diagnosis: UNDETERMINED — AB
High risk HPV: NEGATIVE
Neisseria Gonorrhea: NEGATIVE

## 2025-01-26 ENCOUNTER — Ambulatory Visit: Admitting: Obstetrics and Gynecology
